# Patient Record
Sex: Female | Born: 1985
Health system: Southern US, Community
[De-identification: ages and names within clinical notes are randomized; demographics above are authoritative.]

## PROBLEM LIST (undated history)

## (undated) DIAGNOSIS — E559 Vitamin D deficiency, unspecified: Secondary | ICD-10-CM

## (undated) HISTORY — DX: Vitamin D deficiency, unspecified: E55.9

## (undated) HISTORY — PX: WISDOM TOOTH EXTRACTION: SHX21

---
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
## 2006-01-31 ENCOUNTER — Emergency Department: Payer: Self-pay | Admitting: Emergency Medicine

## 2009-04-02 ENCOUNTER — Emergency Department: Payer: Self-pay | Admitting: Emergency Medicine

## 2009-07-22 ENCOUNTER — Emergency Department: Payer: Self-pay | Admitting: Emergency Medicine

## 2010-02-16 ENCOUNTER — Observation Stay: Payer: Self-pay | Admitting: Obstetrics and Gynecology

## 2010-03-03 ENCOUNTER — Inpatient Hospital Stay: Payer: Self-pay

## 2011-01-16 IMAGING — CR RIGHT INDEX FINGER 2+V
1 series · 3 of 3 positions shown · non-contrast
Comparison: none

REASON FOR EXAM: pain after injury  ed waiting room
COMMENTS:   May transport without cardiac monitor

[Series 1: view not recorded · 0.17mm/px · 3 of 3 slices shown]
[im 1/3]
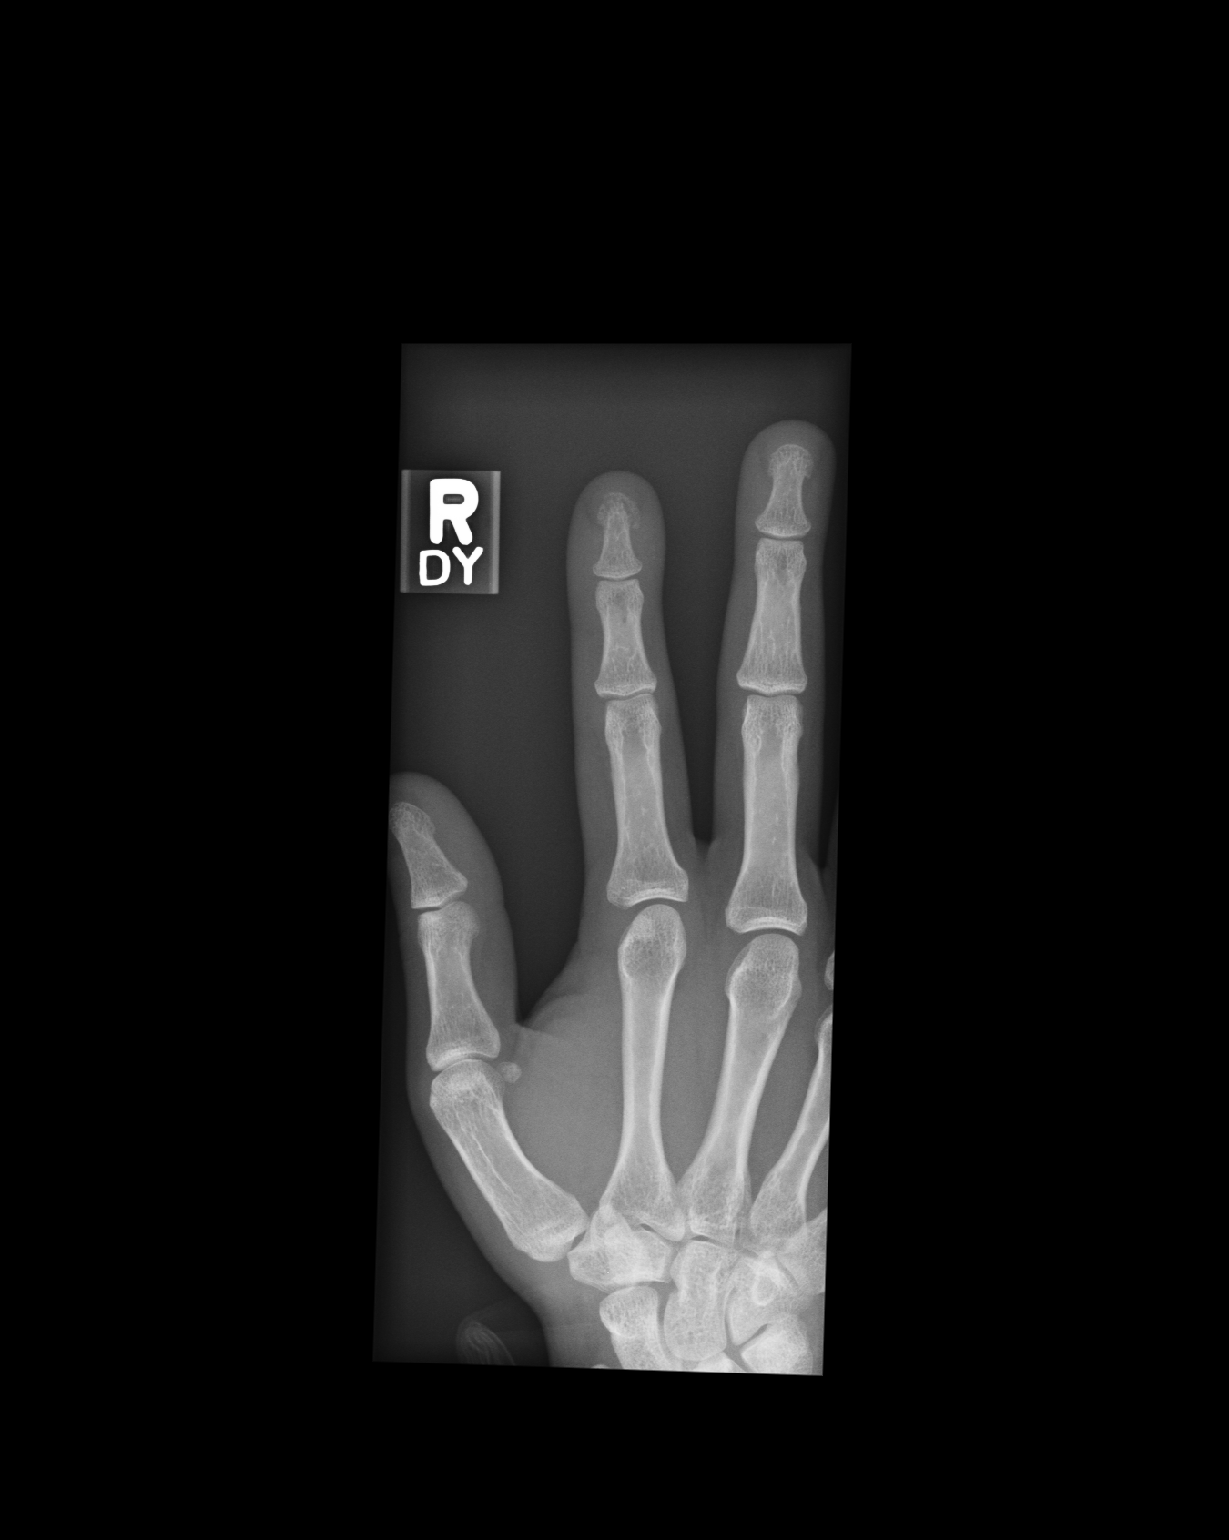
[im 2/3]
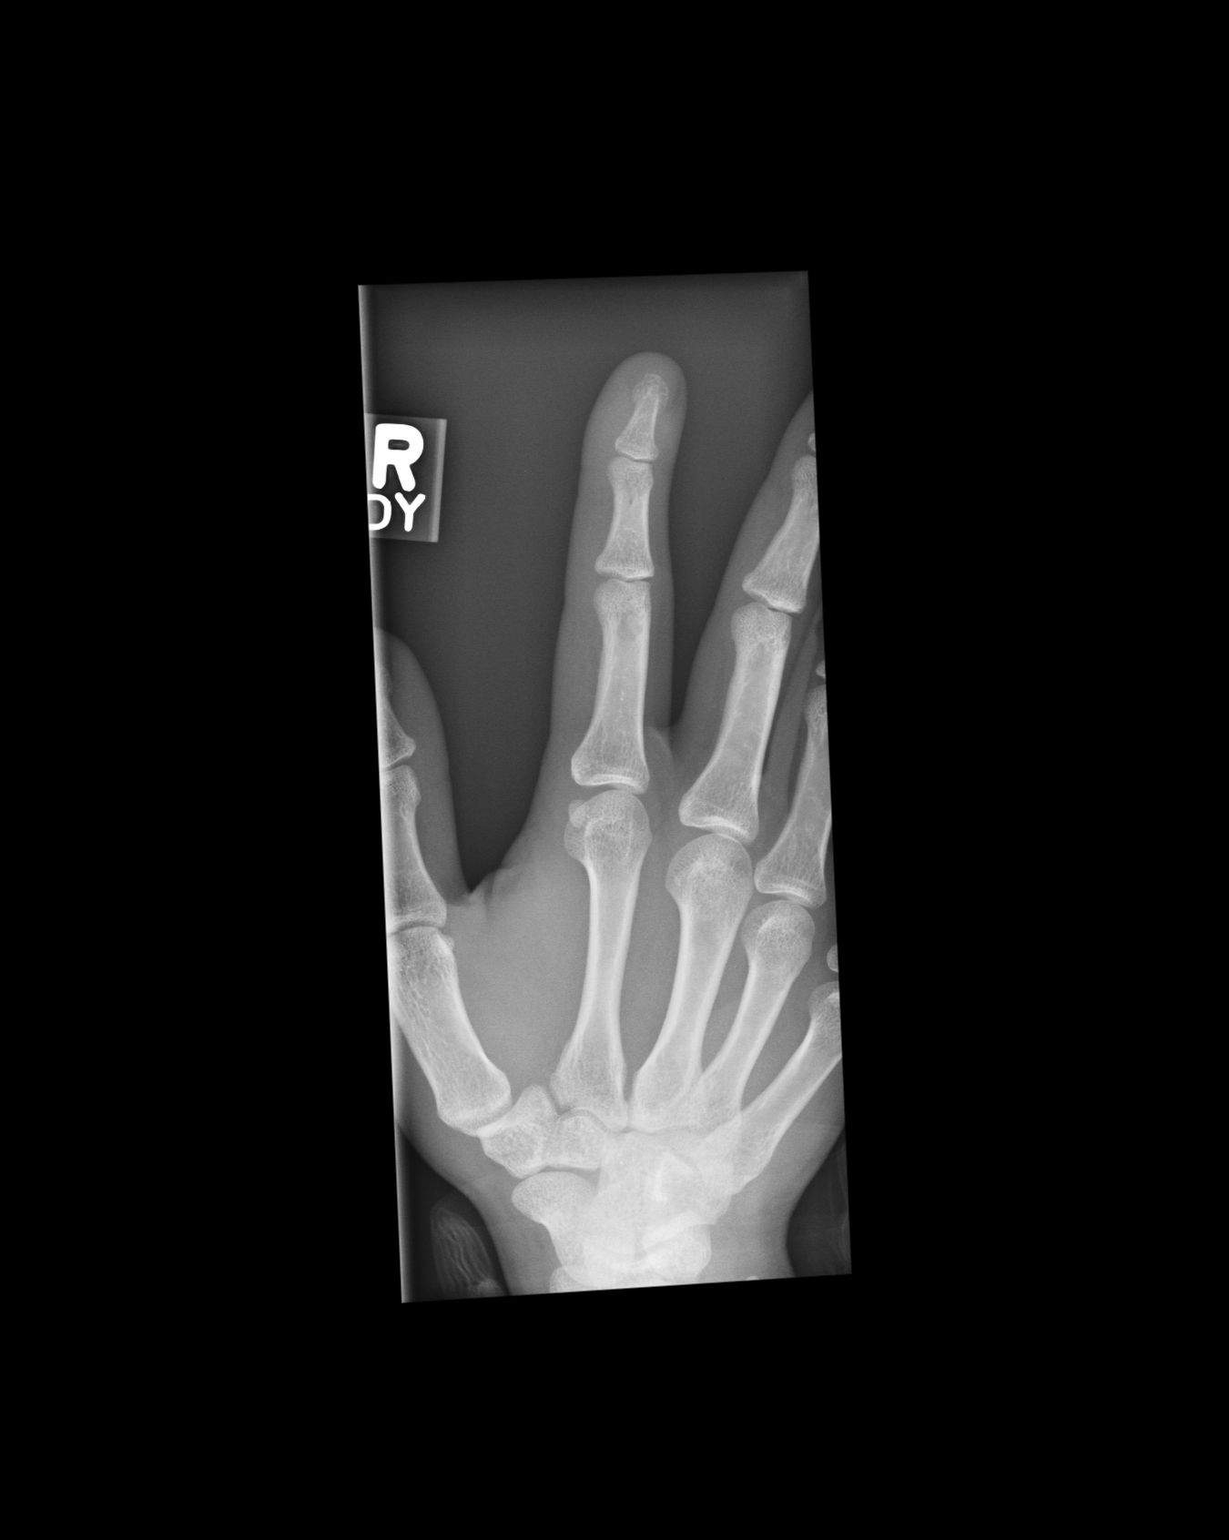
[im 3/3]
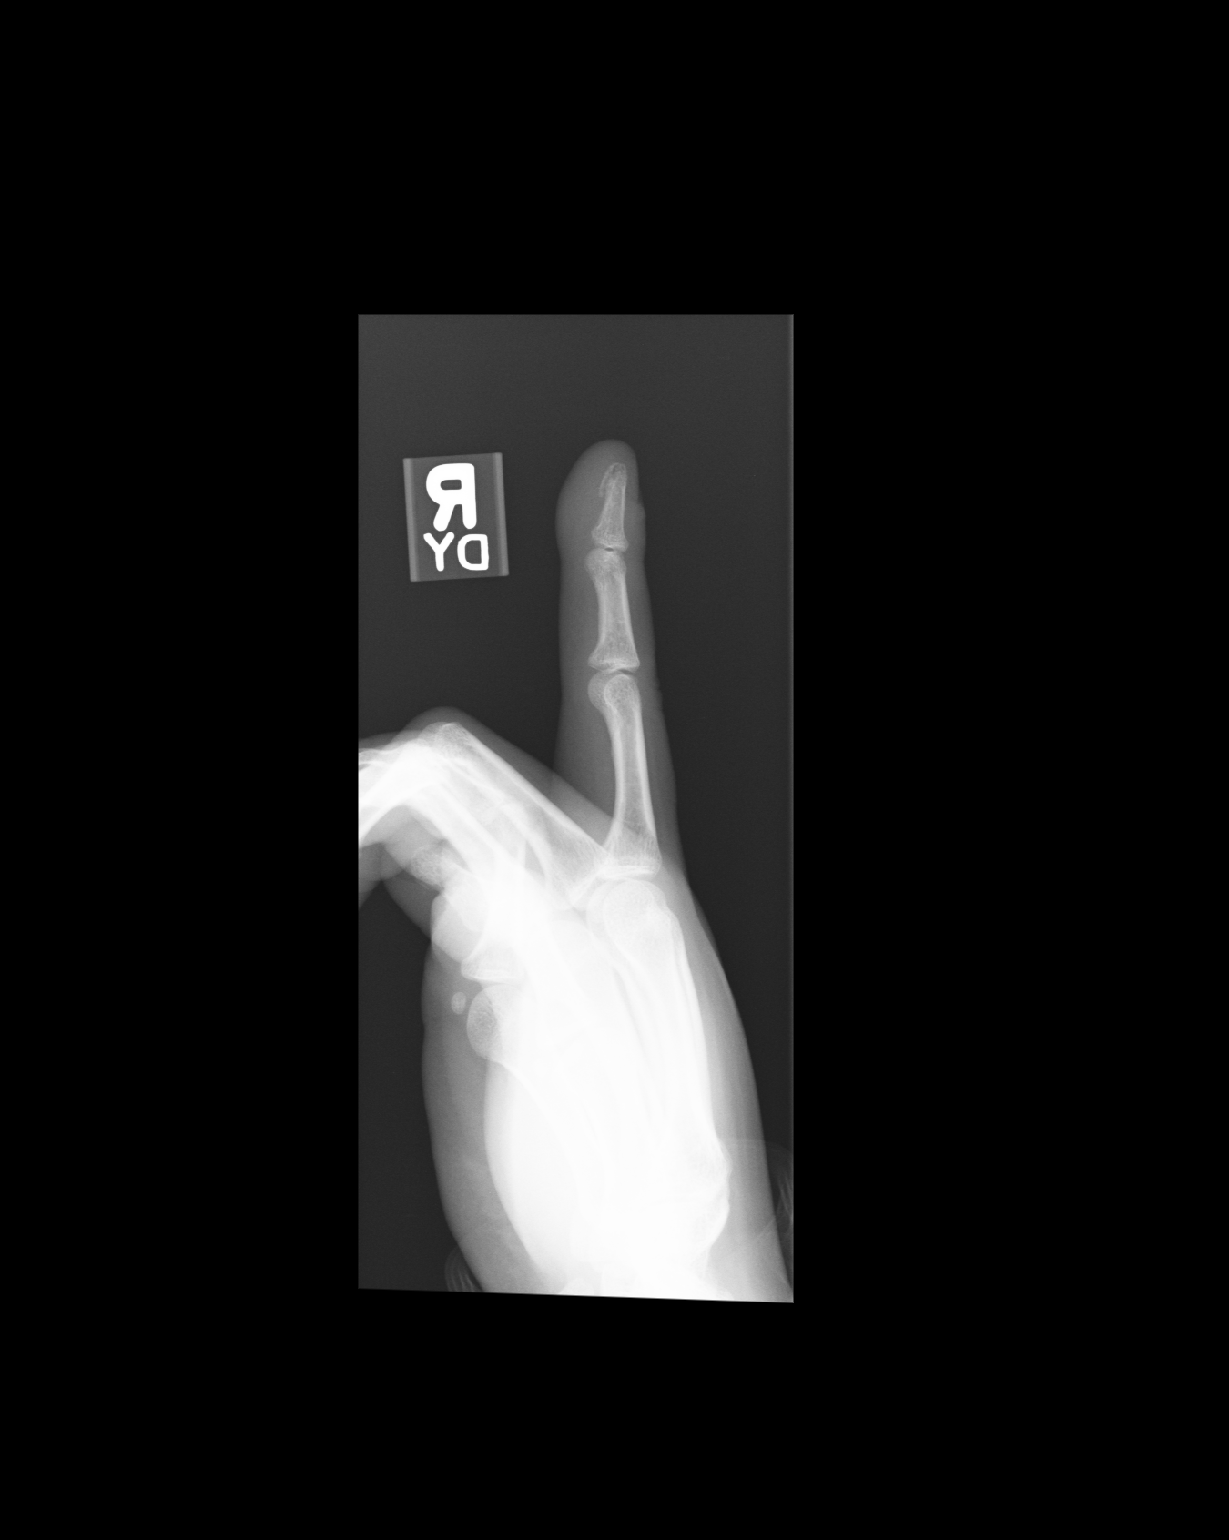

[3 of 3 positions shown; findings below may reference images not displayed]

PROCEDURE:     DXR - DXR FINGER INDEX 2ND DIGIT RT HA  - April 02, 2009 [DATE]

RESULT:     There is observed increased lucency along the medial aspect of
the tuft of the distal phalanx. The possibility of a nondisplaced fracture
of the tuft cannot be totally excluded although the findings also could be
secondary to prominent trabecular markings. No displaced fractures are seen.
No radiodense soft tissue foreign body is observed.
IMPRESSION: 1.     Possible nondisplaced fracture of the tuft of the distal phalanx.

## 2011-03-09 ENCOUNTER — Ambulatory Visit: Payer: Self-pay

## 2011-05-19 ENCOUNTER — Ambulatory Visit: Payer: Self-pay | Admitting: Internal Medicine

## 2011-08-12 ENCOUNTER — Ambulatory Visit: Payer: Self-pay | Admitting: Family Medicine

## 2012-09-10 ENCOUNTER — Emergency Department: Payer: Self-pay | Admitting: Emergency Medicine

## 2012-09-10 ENCOUNTER — Ambulatory Visit: Payer: Self-pay | Admitting: Physician Assistant

## 2012-09-10 LAB — PREGNANCY, URINE: Pregnancy Test, Urine: NEGATIVE m[IU]/mL

## 2012-09-10 LAB — BASIC METABOLIC PANEL
Anion Gap: 9 (ref 7–16)
BUN: 8 mg/dL (ref 7–18)
Calcium, Total: 8.8 mg/dL (ref 8.5–10.1)
Chloride: 105 mmol/L (ref 98–107)
Co2: 27 mmol/L (ref 21–32)
Glucose: 83 mg/dL (ref 65–99)
Sodium: 141 mmol/L (ref 136–145)

## 2012-09-10 LAB — CBC WITH DIFFERENTIAL/PLATELET
Basophil #: 0.1 10*3/uL (ref 0.0–0.1)
Basophil %: 1.1 %
Lymphocyte #: 1.7 10*3/uL (ref 1.0–3.6)
Lymphocyte %: 34.8 %
MCH: 29.2 pg (ref 26.0–34.0)
MCHC: 33.6 g/dL (ref 32.0–36.0)
MCV: 87 fL (ref 80–100)
Monocyte #: 0.4 x10 3/mm (ref 0.2–0.9)
RBC: 4.86 10*6/uL (ref 3.80–5.20)

## 2012-09-10 LAB — URINALYSIS, COMPLETE
Glucose,UR: NEGATIVE mg/dL (ref 0–75)
Ph: 7.5 (ref 4.5–8.0)
Specific Gravity: 1.01 (ref 1.003–1.030)

## 2012-09-12 ENCOUNTER — Ambulatory Visit: Payer: Self-pay | Admitting: Emergency Medicine

## 2012-09-12 LAB — URINE CULTURE

## 2013-06-26 ENCOUNTER — Ambulatory Visit: Payer: Self-pay | Admitting: Physician Assistant

## 2013-12-07 ENCOUNTER — Ambulatory Visit: Payer: Self-pay | Admitting: Emergency Medicine

## 2014-04-19 ENCOUNTER — Ambulatory Visit: Payer: Self-pay | Admitting: Physician Assistant

## 2014-08-24 LAB — HM PAP SMEAR: HM PAP: NEGATIVE

## 2015-01-04 ENCOUNTER — Ambulatory Visit
Admission: EM | Admit: 2015-01-04 | Discharge: 2015-01-04 | Disposition: A | Discharge status: Home / Self Care | Payer: 59 | Attending: Family Medicine | Admitting: Family Medicine

## 2015-01-04 DIAGNOSIS — J069 Acute upper respiratory infection, unspecified: Secondary | ICD-10-CM

## 2015-01-04 LAB — RAPID INFLUENZA A&B ANTIGENS: Influenza B (ARMC): NOT DETECTED

## 2015-01-04 LAB — RAPID INFLUENZA A&B ANTIGENS (ARMC ONLY): INFLUENZA A (ARMC): NOT DETECTED

## 2015-01-04 NOTE — Discharge Instructions (Signed)
Rest, increase fluids Tylenol or ibuprofen as needed as directed Mucinex twice daily as directed for congestion  Upper Respiratory Infection, Adult Most upper respiratory infections (URIs) are a viral infection of the air passages leading to the lungs. A URI affects the nose, throat, and upper air passages. The most common type of URI is nasopharyngitis and is typically referred to as "the common cold." URIs run their course and usually go away on their own. Most of the time, a URI does not require medical attention, but sometimes a bacterial infection in the upper airways can follow a viral infection. This is called a secondary infection. Sinus and middle ear infections are common types of secondary upper respiratory infections. Bacterial pneumonia can also complicate a URI. A URI can worsen asthma and chronic obstructive pulmonary disease (COPD). Sometimes, these complications can require emergency medical care and may be life threatening.  CAUSES Almost all URIs are caused by viruses. A virus is a type of germ and can spread from one person to another.  RISKS FACTORS You may be at risk for a URI if:   You smoke.   You have chronic heart or lung disease.  You have a weakened defense (immune) system.   You are very young or very old.   You have nasal allergies or asthma.  You work in crowded or poorly ventilated areas.  You work in health care facilities or schools. SIGNS AND SYMPTOMS  Symptoms typically develop 2-3 days after you come in contact with a cold virus. Most viral URIs last 7-10 days. However, viral URIs from the influenza virus (flu virus) can last 14-18 days and are typically more severe. Symptoms may include:   Runny or stuffy (congested) nose.   Sneezing.   Cough.   Sore throat.   Headache.   Fatigue.   Fever.   Loss of appetite.   Pain in your forehead, behind your eyes, and over your cheekbones (sinus pain).  Muscle aches.  DIAGNOSIS    Your health care provider may diagnose a URI by:  Physical exam.  Tests to check that your symptoms are not due to another condition such as:  Strep throat.  Sinusitis.  Pneumonia.  Asthma. TREATMENT  A URI goes away on its own with time. It cannot be cured with medicines, but medicines may be prescribed or recommended to relieve symptoms. Medicines may help:  Reduce your fever.  Reduce your cough.  Relieve nasal congestion. HOME CARE INSTRUCTIONS   Take medicines only as directed by your health care provider.   Gargle warm saltwater or take cough drops to comfort your throat as directed by your health care provider.  Use a warm mist humidifier or inhale steam from a shower to increase air moisture. This may make it easier to breathe.  Drink enough fluid to keep your urine clear or pale yellow.   Eat soups and other clear broths and maintain good nutrition.   Rest as needed.   Return to work when your temperature has returned to normal or as your health care provider advises. You may need to stay home longer to avoid infecting others. You can also use a face mask and careful hand washing to prevent spread of the virus.  Increase the usage of your inhaler if you have asthma.   Do not use any tobacco products, including cigarettes, chewing tobacco, or electronic cigarettes. If you need help quitting, ask your health care provider. PREVENTION  The best way to protect yourself from  getting a cold is to practice good hygiene.   Avoid oral or hand contact with people with cold symptoms.   Wash your hands often if contact occurs.  There is no clear evidence that vitamin C, vitamin E, echinacea, or exercise reduces the chance of developing a cold. However, it is always recommended to get plenty of rest, exercise, and practice good nutrition.  SEEK MEDICAL CARE IF:   You are getting worse rather than better.   Your symptoms are not controlled by medicine.   You  have chills.  You have worsening shortness of breath.  You have brown or red mucus.  You have yellow or brown nasal discharge.  You have pain in your face, especially when you bend forward.  You have a fever.  You have swollen neck glands.  You have pain while swallowing.  You have white areas in the back of your throat. SEEK IMMEDIATE MEDICAL CARE IF:   You have severe or persistent:  Headache.  Ear pain.  Sinus pain.  Chest pain.  You have chronic lung disease and any of the following:  Wheezing.  Prolonged cough.  Coughing up blood.  A change in your usual mucus.  You have a stiff neck.  You have changes in your:  Vision.  Hearing.  Thinking.  Mood. MAKE SURE YOU:   Understand these instructions.  Will watch your condition.  Will get help right away if you are not doing well or get worse.   This information is not intended to replace advice given to you by your health care provider. Make sure you discuss any questions you have with your health care provider.   Document Released: 07/29/2000 Document Revised: 06/19/2014 Document Reviewed: 05/10/2013 Elsevier Interactive Patient Education [employer]

## 2015-01-04 NOTE — ED Provider Notes (Signed)
CSN: [id number]     Arrival date & time 01/04/15  1010 History   First MD Initiated Contact with Patient 01/04/15 1236     Chief Complaint  Patient presents with  . URI   HPI   Karen Reyes is a pleasant 29 y.o. female who presents with acute onset of nasal congestion, sore throat, rhinorrhea, fever, and right ear pain. She says her symptoms started last night. She has had nonproductive cough as well. Her MAXIMUM TEMPERATURE was 101. She has a few body aches. She has not received a flu shot this year. Her pain is 2/10. Rest makes her symptoms better. Tylenol helped very minimally. She denies any rash. Denies any ill contacts.   History reviewed. No pertinent past medical history. Past Surgical History  Procedure Laterality Date  . Cesarean section      2012   History reviewed. No pertinent family history. Social History  Substance Use Topics  . Smoking status: Current Every Day Smoker -- 1.00 packs/day    Types: Cigarettes  . Smokeless tobacco: None  . Alcohol Use: Yes     Comment: socially   OB History    No data available     Review of Systems  Constitutional: Negative.   HENT: Positive for congestion, ear pain, postnasal drip, rhinorrhea, sinus pressure, sneezing and sore throat. Negative for ear discharge, tinnitus and trouble swallowing.   Eyes: Negative.   Respiratory: Negative.   Cardiovascular: Negative.   Gastrointestinal: Negative.   Endocrine: Negative.   Genitourinary: Negative.   Musculoskeletal: Positive for myalgias.  Skin: Negative.   Neurological: Negative.   Hematological: Negative.   Psychiatric/Behavioral: Negative.     Allergies  Ciprofloxacin and Penicillins  Home Medications   Prior to Admission medications   Medication Sig Start Date End Date Taking? Authorizing Provider  levonorgestrel (MIRENA, 52 MG,) 20 MCG/24HR IUD 1 each by Intrauterine route once.   Yes Historical Provider, MD   Meds Ordered and Administered this Visit   Medications - No data to display  BP 101/73 mmHg  Pulse 96  Temp(Src) 97.5 F (36.4 C) (Tympanic)  Resp 16  Ht  (1.651 m)  Wt 160 lb (72.576 kg)  BMI 26.63 kg/m2  SpO2 100% No data found.   Physical Exam  Constitutional: She is oriented to person, place, and time. She appears well-developed and well-nourished. No distress.  HENT:  Head: Normocephalic and atraumatic.  Right Ear: Hearing, external ear and ear canal normal. Tympanic membrane is injected, erythematous and bulging.  Left Ear: Hearing, external ear and ear canal normal.  Nose: Mucosal edema and rhinorrhea present. Right sinus exhibits no maxillary sinus tenderness and no frontal sinus tenderness. Left sinus exhibits no maxillary sinus tenderness and no frontal sinus tenderness.  Mouth/Throat: Uvula is midline and mucous membranes are normal. Posterior oropharyngeal erythema present. No oropharyngeal exudate, posterior oropharyngeal edema or tonsillar abscesses.  Eyes: Conjunctivae are normal. Right eye exhibits no discharge. Left eye exhibits no discharge. No scleral icterus.  Neck: Normal range of motion. Neck supple. No thyromegaly present.  Cardiovascular: Normal rate and regular rhythm.  Exam reveals no gallop and no friction rub.   No murmur heard. Pulmonary/Chest: Effort normal and breath sounds normal. No respiratory distress. She has no wheezes. She has no rales.  Abdominal: Soft. Bowel sounds are normal. She exhibits no distension. There is no tenderness. There is no rebound and no guarding.  Musculoskeletal: Normal range of motion. She exhibits no edema or tenderness.  Lymphadenopathy:    She has no cervical adenopathy.  Neurological: She is alert and oriented to person, place, and time. No cranial nerve deficit.  Skin: Skin is warm and dry. No rash noted. No erythema.  Psychiatric: She has a normal mood and affect. Her behavior is normal. Judgment normal.  Nursing note and vitals reviewed.   ED Course   Procedures NA  Labs Review Labs Reviewed  RAPID INFLUENZA A&B ANTIGENS [facility] ONLY)    Imaging Review No results found.   MDM   1. Acute URI   Explained to patient this is likely viral URI. Cough and symptoms may persist for up to 3 weeks at times. Continue supportive measures. Reassured and discussed warning signs for patient to return to clinic including SOB, weakness, unrelenting fever, severe pain or worsening symptoms.  Plan: Diagnosis reviewed with patient Ibuprofen 400-600mg  TID PRN or tylenol PRN Mucinex 1-2 tabs BID PRN Recommend supportive treatment with rest,  Increased fluids Seek additional medical care if symptoms worsen or are not improving    Joselyn ArrowKandice L Therma Lasure, NP 01/04/15 1335

## 2015-01-04 NOTE — ED Notes (Signed)
Started yesterday with runny nose, post nasal drip and now sore throat

## 2015-09-13 ENCOUNTER — Ambulatory Visit
Admission: EM | Admit: 2015-09-13 | Discharge: 2015-09-13 | Disposition: A | Discharge status: Home / Self Care | Payer: 59 | Attending: Family Medicine | Admitting: Family Medicine

## 2015-09-13 DIAGNOSIS — A09 Infectious gastroenteritis and colitis, unspecified: Secondary | ICD-10-CM | POA: Diagnosis not present

## 2015-09-13 DIAGNOSIS — K529 Noninfective gastroenteritis and colitis, unspecified: Secondary | ICD-10-CM

## 2015-09-13 DIAGNOSIS — R197 Diarrhea, unspecified: Secondary | ICD-10-CM

## 2015-09-13 LAB — CBC WITH DIFFERENTIAL/PLATELET
Basophils Absolute: 0 10*3/uL (ref 0–0.1)
Basophils Relative: 1 %
Eosinophils Absolute: 0.2 10*3/uL (ref 0–0.7)
Eosinophils Relative: 2 %
HCT: 43.3 % (ref 35.0–47.0)
Hemoglobin: 14.6 g/dL (ref 12.0–16.0)
Lymphocytes Relative: 15 %
Lymphs Abs: 1.2 10*3/uL (ref 1.0–3.6)
MCH: 29.6 pg (ref 26.0–34.0)
MCHC: 33.7 g/dL (ref 32.0–36.0)
MCV: 87.7 fL (ref 80.0–100.0)
Monocytes Absolute: 0.8 10*3/uL (ref 0.2–0.9)
Monocytes Relative: 10 %
Neutro Abs: 5.9 10*3/uL (ref 1.4–6.5)
Neutrophils Relative %: 72 %
Platelets: 273 10*3/uL (ref 150–440)
RBC: 4.94 MIL/uL (ref 3.80–5.20)
RDW: 14.8 % — ABNORMAL HIGH (ref 11.5–14.5)
WBC: 8 10*3/uL (ref 3.6–11.0)

## 2015-09-13 LAB — COMPREHENSIVE METABOLIC PANEL
ALT: 29 U/L (ref 14–54)
AST: 19 U/L (ref 15–41)
Albumin: 4.2 g/dL (ref 3.5–5.0)
Alkaline Phosphatase: 58 U/L (ref 38–126)
Anion gap: 6 (ref 5–15)
BUN: 9 mg/dL (ref 6–20)
CO2: 26 mmol/L (ref 22–32)
Calcium: 8.9 mg/dL (ref 8.9–10.3)
Chloride: 104 mmol/L (ref 101–111)
Creatinine, Ser: 0.66 mg/dL (ref 0.44–1.00)
GFR calc Af Amer: >60 mL/min (ref >60)
GFR calc non Af Amer: >60 mL/min (ref >60)
Glucose, Bld: 86 mg/dL (ref 65–99)
Potassium: 3.8 mmol/L (ref 3.5–5.1)
Sodium: 136 mmol/L (ref 135–145)
Total Bilirubin: 0.7 mg/dL (ref 0.3–1.2)
Total Protein: 7.4 g/dL (ref 6.5–8.1)

## 2015-09-13 MED ORDER — ONDANSETRON 8 MG PO TBDP
8.0000 mg | ORAL_TABLET | Freq: Once | ORAL | Status: AC
  Administered 2015-09-13: 8 mg via ORAL

## 2015-09-13 MED ORDER — ONDANSETRON 8 MG PO TBDP: 8.0000 mg | ORAL_TABLET | Freq: Two times a day (BID) | ORAL | 0 refills | Status: DC

## 2015-09-13 NOTE — ED Triage Notes (Signed)
Pt c/o N/V/D since last night.. Emesis x2, diarrhea x3 today.Marland Kitchen

## 2015-09-13 NOTE — ED Provider Notes (Signed)
CSN: [id number]     Arrival date & time 09/13/15  1442 History   First MD Initiated Contact with Patient 09/13/15 1632     Chief Complaint  Patient presents with  . Emesis  . Diarrhea  . Sore Throat   (Consider location/radiation/quality/duration/timing/severity/associated sxs/prior Treatment) HPI   This is a 30 year old female who presents with the sudden onset of watery diarrhea without mucus or blood vomiting 3 and queasy feeling of her stomach although she does deny any pain. She ate at the [employer] last night around 7 PM began vomiting have diarrhea around 1 AM. Her last bowel movement was at 1 PM this afternoon she vomited shortly before I went in the room according to the patient. Is no dark coffee-ground material; in her last bowel movement was green. He states that her boyfriend ate very similar food that she had and he also is not feeling well although has not had this severe symptoms but his vomiting and has diarrhea. .denies any fever or chills.  History reviewed. No pertinent past medical history. Past Surgical History:  Procedure Laterality Date  . CESAREAN SECTION     2012   Family History  Problem Relation Age of Onset  . Healthy Mother   . Healthy Father    Social History  Substance Use Topics  . Smoking status: Current Every Day Smoker    Packs/day: 1.00    Types: Cigarettes  . Smokeless tobacco: Never Used  . Alcohol use Yes     Comment: socially   OB History    No data available     Review of Systems  Constitutional: Positive for activity change and appetite change. Negative for chills, fatigue and fever.  All other systems reviewed and are negative.   Allergies  Ciprofloxacin and Penicillins  Home Medications   Prior to Admission medications   Medication Sig Start Date End Date Taking? Authorizing Provider  levonorgestrel (MIRENA, 52 MG,) 20 MCG/24HR IUD 1 each by Intrauterine route once.    Historical Provider, MD  ondansetron (ZOFRAN  ODT) 8 MG disintegrating tablet Take 1 tablet (8 mg total) by mouth 2 (two) times daily. 09/13/15   Lorin Picket, PA-C   Meds Ordered and Administered this Visit   Medications  ondansetron (ZOFRAN-ODT) disintegrating tablet 8 mg (8 mg Oral Given 09/13/15 1651)    BP 128/65 (BP Location: Left Arm)   Pulse (!) 102   Temp 98.2 F (36.8 C) (Oral)   Resp 18   Ht '5\' 2"'  (1.575 m)   Wt 148 lb (67.1 kg)   SpO2 99%   BMI 27.07 kg/m  No data found.   Physical Exam  Constitutional: She is oriented to person, place, and time. She appears well-developed and well-nourished. No distress.  HENT:  Head: Normocephalic and atraumatic.  Eyes: EOM are normal. Pupils are equal, round, and reactive to light.  Neck: Normal range of motion. Neck supple.  Pulmonary/Chest: Effort normal and breath sounds normal. No respiratory distress. She has no wheezes. She has no rales.  Abdominal: Soft. Bowel sounds are normal. She exhibits no distension and no mass. There is tenderness. There is no rebound and no guarding.  She has some mild tenderness in the right lower quadrant No other tenderness could be elicited. Nira Conn, RN was a Education officer, museum  Musculoskeletal: Normal range of motion. She exhibits no edema, tenderness or deformity.  Neurological: She is alert and oriented to person, place, and time.  Skin: Skin is warm  and dry. She is not diaphoretic.  Psychiatric: She has a normal mood and affect. Her behavior is normal. Judgment and thought content normal.  Nursing note and vitals reviewed.   Urgent Care Course   Clinical Course    Procedures (including critical care time)  Labs Review Labs Reviewed  CBC WITH DIFFERENTIAL/PLATELET - Abnormal; Notable for the following:       Result Value   RDW 14.8 (*)    All other components within normal limits  COMPREHENSIVE METABOLIC PANEL    Imaging Review No results found.   Visual Acuity Review  Right Eye Distance:   Left Eye  Distance:   Bilateral Distance:    Right Eye Near:   Left Eye Near:    Bilateral Near:     Medications  ondansetron (ZOFRAN-ODT) disintegrating tablet 8 mg (8 mg Oral Given 09/13/15 1651)      MDM   1. Diarrhea of presumed infectious origin   2. Gastroenteritis    New Prescriptions   ONDANSETRON (ZOFRAN ODT) 8 MG DISINTEGRATING TABLET    Take 1 tablet (8 mg total) by mouth 2 (two) times daily.  Plan: 1. Test/x-ray results and diagnosis reviewed with patient 2. rx as per orders; risks, benefits, potential side effects reviewed with patient 3. Recommend supportive treatment with Increased fluids. Patient is given a kit and a prescription for a GI panel by PCR and she'll return to our clinic tomorrow. Her pain becomes worse in the right lower quadrant runs a fever or is having intractable nausea or vomiting or diarrhea she should go immediately to the emergency department 4. F/u prn if symptoms worsen or don't improve    Lorin Picket, PA-C 09/13/15 1801

## 2016-11-12 ENCOUNTER — Emergency Department: Payer: Self-pay

## 2016-11-12 ENCOUNTER — Emergency Department
Admission: EM | Admit: 2016-11-12 | Discharge: 2016-11-12 | Disposition: A | Discharge status: Home / Self Care | Payer: Self-pay | Attending: Emergency Medicine | Admitting: Emergency Medicine

## 2016-11-12 ENCOUNTER — Encounter: Payer: Self-pay | Admitting: Emergency Medicine

## 2016-11-12 DIAGNOSIS — F17210 Nicotine dependence, cigarettes, uncomplicated: Secondary | ICD-10-CM | POA: Insufficient documentation

## 2016-11-12 DIAGNOSIS — Z79899 Other long term (current) drug therapy: Secondary | ICD-10-CM | POA: Insufficient documentation

## 2016-11-12 DIAGNOSIS — J40 Bronchitis, not specified as acute or chronic: Secondary | ICD-10-CM | POA: Insufficient documentation

## 2016-11-12 DIAGNOSIS — J040 Acute laryngitis: Secondary | ICD-10-CM | POA: Insufficient documentation

## 2016-11-12 MED ORDER — METHYLPREDNISOLONE 4 MG PO TBPK: ORAL_TABLET | ORAL | 0 refills | Status: DC

## 2016-11-12 MED ORDER — PSEUDOEPH-BROMPHEN-DM 30-2-10 MG/5ML PO SYRP: 5.0000 mL | ORAL_SOLUTION | Freq: Four times a day (QID) | ORAL | 0 refills | Status: DC | PRN

## 2016-11-12 MED ORDER — AZITHROMYCIN 250 MG PO TABS: ORAL_TABLET | ORAL | 0 refills | Status: AC

## 2016-11-12 NOTE — ED Provider Notes (Signed)
[facility] Emergency Department Provider Note  Patient complaining of cough, nasal congestion and cough ____________________________________________   First MD Initiated Contact with Patient 11/12/16 1648     (approximate)  I have reviewed the triage vital signs and the nursing notes.   HISTORY  Chief Complaint    HPI Karen Reyes is a 30 y.o. female patient complaining of cough, congestion, sore throat for 1 week. Patient stated cough initially nonproductive but is now productive of bloody sputum. Patient also complaining of some decreased forced volume and body aches..Patient rates her pain as a 5/10. Patient's, pain as "achy". Patient states no nausea or vomiting. Patient state loose stools but she would not characterize the stool as diarrhea. No palliative measures for complaint.  No past medical history on file.  There are no active problems to display for this patient.   Past Surgical History:  Procedure Laterality Date  . CESAREAN SECTION     2012    Prior to Admission medications   Medication Sig Start Date End Date Taking? Authorizing Provider  azithromycin (ZITHROMAX Z-PAK) 250 MG tablet Take 2 tablets (500 mg) on  Day 1,  followed by 1 tablet (250 mg) once daily on Days 2 through 5. 11/12/16 11/17/16  Joni Reining, PA-C  brompheniramine-pseudoephedrine-DM 30-2-10 MG/5ML syrup Take 5 mLs by mouth 4 (four) times daily as needed. 11/12/16   Joni Reining, PA-C  levonorgestrel (MIRENA, 52 MG,) 20 MCG/24HR IUD 1 each by Intrauterine route once.    [provider]  methylPREDNISolone (MEDROL DOSEPAK) 4 MG TBPK tablet Take Tapered dose as directed 11/12/16   Joni Reining, PA-C  ondansetron (ZOFRAN ODT) 8 MG disintegrating tablet Take 1 tablet (8 mg total) by mouth 2 (two) times daily. 09/13/15   Lutricia Feil, PA-C    Allergies Ciprofloxacin and Penicillins  Family History  Problem Relation Age of Onset  . Healthy  Mother   . Healthy Father     Social History Social History  Substance Use Topics  . Smoking status: Current Every Day Smoker    Packs/day: 1.00    Types: Cigarettes  . Smokeless tobacco: Never Used  . Alcohol use Yes     Comment: socially    Review of Systems  Constitutional: No fever/chills. Body aches Eyes: No visual changes. ENT: Sore throat and nasal congestion. Cardiovascular: Denies chest pain. Respiratory: Denies shortness of breath. Productive cough cough. Gastrointestinal: No abdominal pain.  No nausea, no vomiting.  No diarrhea.  No constipation. Genitourinary: Negative for dysuria. Musculoskeletal: Negative for back pain. Skin: Negative for rash. Neurological: Negative for headaches, focal weakness or numbness. Allergic/Immunilogical: Cipro and penicillin ____________________________________________   PHYSICAL EXAM:  VITAL SIGNS: ED Triage Vitals  Enc Vitals Group     BP 11/12/16 1642 116/66     Pulse Rate 11/12/16 1642 94     Resp 11/12/16 1642 20     Temp 11/12/16 1642 98.4 F (36.9 C)     Temp Source 11/12/16 1642 Oral     SpO2 11/12/16 1642 100 %     Weight 11/12/16 1637 132 lb (59.9 kg)     Height 11/12/16 1637  (1.626 m)     Head Circumference --      Peak Flow --      Pain Score 11/12/16 1637 5     Pain Loc --      Pain Edu? --      Excl. in GC? --  Constitutional: Alert and oriented. Well appearing and in no acute distress. Nose: Edematous nasal turbinates clear rhinorrhea Mouth/Throat: Mucous membranes are moist.  Oropharynx non-erythematous. Neck: No stridor. Hematological/Lymphatic/Immunilogical: No cervical lymphadenopathy. Cardiovascular: Normal rate, regular rhythm. Grossly normal heart sounds.  Good peripheral circulation. Respiratory: Normal respiratory effort.  No retractions. Lungs Diffuse Rales. Musculoskeletal: No lower extremity tenderness nor edema.  No joint effusions. Neurologic:  Normal speech and language. No  gross focal neurologic deficits are appreciated. No gait instability. Skin:  Skin is warm, dry and intact. No rash noted. Psychiatric: Mood and affect are normal. Speech and behavior are normal.  ____________________________________________   LABS (all labs ordered are listed, but only abnormal results are displayed)  Labs Reviewed - No data to display ____________________________________________  EKG   ____________________________________________  RADIOLOGY  Dg Chest 2 View  Result Date: 11/12/2016 CLINICAL DATA:  Cough and congestion. EXAM: CHEST  2 VIEW COMPARISON:  No recent prior . FINDINGS: Mediastinum hilar structures normal scratched it mediastinum hilar structures are unremarkable. Mild peribronchial cuffing. Mild bronchitis cannot be excluded. No focal infiltrate. No pleural effusion or pneumothorax. IMPRESSION: Mild peribronchial cuffing. Mild bronchitis cannot be excluded. Exam otherwise unremarkable. Electronically Signed   By: Maisie Fus  Register   On: 11/12/2016 17:12    __Chest x-ray consistent with bronchitis __________________________________________   PROCEDURES  Procedure(s) performed: None  Procedures  Critical Care performed: No  ____________________________________________   INITIAL IMPRESSION / ASSESSMENT AND PLAN / ED COURSE  Pertinent labs & imaging results that were available during my care of the patient were reviewed by me and considered in my medical decision making (see chart for details).  Abdomen congestion secondary to bronchitis. Patient also has laryngitis. Patient given discharge care instructions. Patient advised take medication as directed. Patient advised to follow-up with [facility] if condition persists.      ____________________________________________   FINAL CLINICAL IMPRESSION(S) / ED DIAGNOSES  Final diagnoses:  Bronchitis  Laryngitis      NEW MEDICATIONS STARTED DURING THIS VISIT:  New  Prescriptions   AZITHROMYCIN (ZITHROMAX Z-PAK) 250 MG TABLET    Take 2 tablets (500 mg) on  Day 1,  followed by 1 tablet (250 mg) once daily on Days 2 through 5.   BROMPHENIRAMINE-PSEUDOEPHEDRINE-DM 30-2-10 MG/5ML SYRUP    Take 5 mLs by mouth 4 (four) times daily as needed.   METHYLPREDNISOLONE (MEDROL DOSEPAK) 4 MG TBPK TABLET    Take Tapered dose as directed     Note:  This document was prepared using Dragon voice recognition software and may include unintentional dictation errors.    Joni Reining, PA-C 11/12/16 1729    Jeanmarie Plant, MD 11/12/16 2055

## 2016-11-12 NOTE — ED Triage Notes (Signed)
States she developed cough,congestion and sore throat about 1 week ago  States cough was non prod at first now states she is coughing up some blood today   Also has had some body aches

## 2017-08-23 ENCOUNTER — Encounter: Payer: Self-pay | Admitting: Maternal Newborn

## 2017-08-26 ENCOUNTER — Other Ambulatory Visit (HOSPITAL_COMMUNITY)
Admission: RE | Admit: 2017-08-26 | Discharge: 2017-08-26 | Disposition: A | Discharge status: Home / Self Care | Payer: Managed Care, Other (non HMO) | Source: Ambulatory Visit | Attending: Maternal Newborn | Admitting: Maternal Newborn

## 2017-08-26 ENCOUNTER — Ambulatory Visit (INDEPENDENT_AMBULATORY_CARE_PROVIDER_SITE_OTHER): Payer: Managed Care, Other (non HMO) | Admitting: Maternal Newborn

## 2017-08-26 ENCOUNTER — Encounter: Payer: Self-pay | Admitting: Maternal Newborn

## 2017-08-26 VITALS — BP 90/60 | HR 90 | Ht 62.0 in | Wt 127.0 lb

## 2017-08-26 DIAGNOSIS — Z113 Encounter for screening for infections with a predominantly sexual mode of transmission: Secondary | ICD-10-CM | POA: Insufficient documentation

## 2017-08-26 DIAGNOSIS — Z23 Encounter for immunization: Secondary | ICD-10-CM

## 2017-08-26 DIAGNOSIS — Z01419 Encounter for gynecological examination (general) (routine) without abnormal findings: Secondary | ICD-10-CM

## 2017-08-26 DIAGNOSIS — E559 Vitamin D deficiency, unspecified: Secondary | ICD-10-CM | POA: Insufficient documentation

## 2017-08-26 DIAGNOSIS — Z124 Encounter for screening for malignant neoplasm of cervix: Secondary | ICD-10-CM | POA: Insufficient documentation

## 2017-08-26 DIAGNOSIS — Z01411 Encounter for gynecological examination (general) (routine) with abnormal findings: Secondary | ICD-10-CM

## 2017-08-26 DIAGNOSIS — F17210 Nicotine dependence, cigarettes, uncomplicated: Secondary | ICD-10-CM

## 2017-08-26 DIAGNOSIS — F17209 Nicotine dependence, unspecified, with unspecified nicotine-induced disorders: Secondary | ICD-10-CM | POA: Insufficient documentation

## 2017-08-26 MED ORDER — TETANUS-DIPHTH-ACELL PERTUSSIS 5-2.5-18.5 LF-MCG/0.5 IM SUSP
0.5000 mL | Freq: Once | INTRAMUSCULAR | Status: AC
  Administered 2017-08-26: 0.5 mL via INTRAMUSCULAR

## 2017-08-26 NOTE — Progress Notes (Signed)
Gynecology Annual Exam  PCP: Jenell Milliner, MD  Chief Complaint:  Chief Complaint  Patient presents with  . Gynecologic Exam    History of Present Illness: Patient is a 32 y.o. G1P1001 presents for annual exam. The patient has no complaints today.   LMP: No LMP recorded (lmp unknown). (Menstrual status: IUD). Menses absent with Mirena IUD.  The patient is sexually active. She currently uses IUD for contraception. She does not have dyspareunia.  The patient does perform self breast exams.  There is no notable family history of breast or ovarian cancer in her family.  The patient does not have current symptoms of depression.    Review of Systems  Constitutional: Positive for weight loss.  HENT: Negative.   Eyes: Negative.   Respiratory: Negative for cough, shortness of breath and wheezing.   Cardiovascular: Negative for chest pain and palpitations.  Gastrointestinal: Negative.   Genitourinary: Negative.   Musculoskeletal: Negative.   Skin: Negative.   Neurological: Negative.   Endo/Heme/Allergies: Negative.   Psychiatric/Behavioral: Negative.   All other systems reviewed and are negative.   Past Medical History:  Past Medical History:  Diagnosis Date  . Vitamin D deficiency     Past Surgical History:  Past Surgical History:  Procedure Laterality Date  . CESAREAN SECTION  03/04/2010  . WISDOM TOOTH EXTRACTION     one    Gynecologic History:  No LMP recorded (lmp unknown). (Menstrual status: IUD). Contraception: IUD Last Pap: 08/24/2014.  Results were: NIL   Obstetric History: G1P1001  Family History:  Family History  Problem Relation Age of Onset  . Healthy Mother   . Healthy Father   . COPD Maternal Grandmother   . Hypertension Maternal Grandmother   . Osteoporosis Maternal Grandmother   . Thyroid disease Maternal Grandmother   . Diabetes Maternal Grandfather        TYPE 2  . Heart disease Maternal Grandfather        DIED OF MI AGE 21    Social  History:  Social History   Socioeconomic History  . Marital status: Single    Spouse name: Not on file  . Number of children: 1  . Years of education: 53  . Highest education level: Not on file  Occupational History  . Occupation: RETAIL    Comment: [employer]  Social Needs  . Financial resource strain: Not on file  . Food insecurity:    Worry: Not on file    Inability: Not on file  . Transportation needs:    Medical: Not on file    Non-medical: Not on file  Tobacco Use  . Smoking status: Current Every Day Smoker    Packs/day: 0.50    Types: Cigarettes  . Smokeless tobacco: Never Used  Substance and Sexual Activity  . Alcohol use: Yes    Comment: socially  . Drug use: Yes    Types: Marijuana    Comment: LIGHT  . Sexual activity: Yes    Birth control/protection: IUD  Lifestyle  . Physical activity:    Days per week: Not on file    Minutes per session: Not on file  . Stress: Not on file  Relationships  . Social connections:    Talks on phone: Not on file    Gets together: Not on file    Attends religious service: Not on file    Active member of club or organization: Not on file    Attends meetings of clubs or organizations:  Not on file    Relationship status: Not on file  . Intimate partner violence:    Fear of current or ex partner: Not on file    Emotionally abused: Not on file    Physically abused: Not on file    Forced sexual activity: Not on file  Other Topics Concern  . Not on file  Social History Narrative  . Not on file    Allergies:  Allergies  Allergen Reactions  . Ciprofloxacin Hives    BODY RASH, THROAT CLOSED UP  . Penicillins Hives and Nausea And Vomiting    HIVES; BREATHING DIFFICULTY    Medications: Prior to Admission medications   Medication Sig Start Date End Date Taking? Authorizing Provider  Cholecalciferol (D 10000) 10000 units CAPS Take 1 capsule by mouth daily. 08/16/17  Yes [provider]  levonorgestrel (MIRENA,  52 MG,) 20 MCG/24HR IUD 1 each by Intrauterine route once.   Yes [provider]    Physical Exam Vitals: Blood pressure 90/60, pulse 90, height 5\' 2"  (1.575 m), weight 127 lb (57.6 kg). Body mass index is 23.23 kg/m.  General: NAD HEENT: normocephalic, anicteric Thyroid: no enlargement, no palpable nodules Pulmonary: No increased work of breathing, CTAB Cardiovascular: RRR, no murmurs, rubs or gallops Breast: Breasts symmetrical, no tenderness, no palpable nodules or masses, no skin or nipple retraction present, no nipple discharge.  No axillary or supraclavicular lymphadenopathy. Abdomen: soft, non-tender, non-distended.  Umbilicus without lesions.  No hepatomegaly, splenomegaly or masses palpable. No evidence of hernia  Genitourinary:  External: Normal external female genitalia.  Normal  urethral meatus, normal Bartholin's and Skene's glands.    Vagina: Normal vaginal mucosa, no evidence of prolapse.    Cervix: Grossly normal in appearance, no bleeding. IUD  strings easily visible at os.  Uterus: Non-enlarged, mobile, normal contour.  No CMT  Adnexa: ovaries non-enlarged, no adnexal masses  Rectal: deferred  Lymphatic: no evidence of inguinal lymphadenopathy Extremities: no edema, erythema, or tenderness Neurologic: Grossly intact Psychiatric: mood appropriate, affect full  Assessment: 32 y.o. G1P1001 for annual exam.  Plan: Problem List Items Addressed This Visit    None    Visit Diagnoses    Need for diphtheria-tetanus-pertussis (Tdap) vaccine    -  Primary   Relevant Medications   Tdap (BOOSTRIX) injection 0.5 mL (Completed) (Start on 08/26/2017  2:15 PM)   Women's annual routine gynecological examination       Pap smear for cervical cancer screening       Relevant Orders   Cytology - PAP   Screening for STDs (sexually transmitted diseases)       Relevant Orders   Cytology - PAP      1) STI screening was offered and accepted.  2) ASCCP guidelines for Pap  smears discussed.  Patient opts for every 3 year screening interval.  3) Contraception - She currently has a Mirena IUD placed 05/17/2015 and is satisfied with this method.  4) Routine healthcare maintenance including cholesterol, diabetes screening discussed: managed by PCP. Had labs done on 6/28; normal except low Vitamin D and B12 and she is taking supplements.  5) Smoking cessation: She has recently been cutting down on the number of cigarettes that she smokes daily, and no longer smokes in the house. Currently about 0.5/ppd. Not ready to try nicotine patches or gum, but working on decreasing number of cigarettes. Travelling long distances in the car is a trigger for her to smoke that she is trying to overcome.  6) Follow up 1 year for routine annual exam.  Marcelyn BruinsJacelyn Schmid, CNM 08/26/2017  2:13 PM

## 2017-08-27 LAB — CYTOLOGY - PAP
CHLAMYDIA, DNA PROBE: NEGATIVE
Diagnosis: NEGATIVE
HPV: NOT DETECTED
Neisseria Gonorrhea: NEGATIVE
TRICH (WINDOWPATH): POSITIVE — AB

## 2017-08-31 ENCOUNTER — Other Ambulatory Visit: Payer: Self-pay | Admitting: Maternal Newborn

## 2017-08-31 DIAGNOSIS — A599 Trichomoniasis, unspecified: Secondary | ICD-10-CM

## 2017-08-31 MED ORDER — METRONIDAZOLE 500 MG PO TABS: 2000.0000 mg | ORAL_TABLET | Freq: Once | ORAL | 0 refills | Status: AC

## 2017-08-31 NOTE — Progress Notes (Signed)
Rx sent for Flagyl. She is aware to pick up and that partner treatment is recommended.

## 2017-09-01 ENCOUNTER — Encounter: Payer: Self-pay | Admitting: Maternal Newborn

## 2017-09-05 ENCOUNTER — Other Ambulatory Visit: Payer: Self-pay

## 2017-09-05 ENCOUNTER — Ambulatory Visit
Admission: EM | Admit: 2017-09-05 | Discharge: 2017-09-05 | Disposition: A | Discharge status: Home / Self Care | Payer: Managed Care, Other (non HMO) | Attending: Family Medicine | Admitting: Family Medicine

## 2017-09-05 ENCOUNTER — Encounter: Payer: Self-pay | Admitting: Emergency Medicine

## 2017-09-05 DIAGNOSIS — B349 Viral infection, unspecified: Secondary | ICD-10-CM | POA: Diagnosis not present

## 2017-09-05 DIAGNOSIS — R0981 Nasal congestion: Secondary | ICD-10-CM

## 2017-09-05 DIAGNOSIS — J029 Acute pharyngitis, unspecified: Secondary | ICD-10-CM

## 2017-09-05 DIAGNOSIS — R197 Diarrhea, unspecified: Secondary | ICD-10-CM | POA: Diagnosis not present

## 2017-09-05 LAB — RAPID STREP SCREEN (MED CTR MEBANE ONLY): Streptococcus, Group A Screen (Direct): NEGATIVE

## 2017-09-05 MED ORDER — LORATADINE-PSEUDOEPHEDRINE ER 5-120 MG PO TB12: 1.0000 | ORAL_TABLET | Freq: Two times a day (BID) | ORAL | 0 refills | Status: DC

## 2017-09-05 NOTE — ED Provider Notes (Signed)
[facility]    CSN: [id number] Arrival date & time: 09/05/17  1353     History   Chief Complaint Chief Complaint  Patient presents with  . Sore Throat    HPI Karen Reyes is a 32 y.o. female.   Presents to the urgent care facility for evaluation of sore throat and diarrhea x1 day.  She is also had congestion and posterior pharyngeal drainage with pressures in both ears.  She denies any fevers, difficulty swallowing, chest pain, shortness of breath.  No abdominal pain.  No nausea or vomiting.  Diarrhea has been one episode daily with no noticeable blood.  She denies any skin rashes or headaches.  Does not take any medications for her symptoms. HPI  Past Medical History:  Diagnosis Date  . Vitamin D deficiency     Patient Active Problem List   Diagnosis Date Noted  . Tobacco use disorder, continuous 08/26/2017  . Vitamin D deficiency 08/26/2017    Past Surgical History:  Procedure Laterality Date  . CESAREAN SECTION  03/04/2010  . WISDOM TOOTH EXTRACTION     one    OB History    Gravida  1   Para  1   Term  1   Preterm      AB      Living  1     SAB      TAB      Ectopic      Multiple      Live Births  1            Home Medications    Prior to Admission medications   Medication Sig Start Date End Date Taking? Authorizing Provider  Cholecalciferol (D 10000) 10000 units CAPS Take 1 capsule by mouth daily. 08/16/17  Yes [provider]  levonorgestrel (MIRENA, 52 MG,) 20 MCG/24HR IUD 1 each by Intrauterine route once.    [provider]  loratadine-pseudoephedrine (CLARITIN-D 12 HOUR) 5-120 MG tablet Take 1 tablet by mouth 2 (two) times daily. 09/05/17   Evon Slack, PA-C  metroNIDAZOLE (FLAGYL) 500 MG tablet  08/31/17   [provider]    Family History Family History  Problem Relation Age of Onset  . Healthy Mother   . Healthy Father   . COPD Maternal Grandmother   . Hypertension Maternal  Grandmother   . Osteoporosis Maternal Grandmother   . Thyroid disease Maternal Grandmother   . Diabetes Maternal Grandfather        TYPE 2  . Heart disease Maternal Grandfather        DIED OF MI AGE 69    Social History Social History   Tobacco Use  . Smoking status: Current Every Day Smoker    Packs/day: 0.50    Types: Cigarettes  . Smokeless tobacco: Never Used  Substance Use Topics  . Alcohol use: Yes    Comment: socially  . Drug use: Yes    Types: Marijuana    Comment: LIGHT     Allergies   Ciprofloxacin and Penicillins   Review of Systems Review of Systems  Constitutional: Negative for fever.  HENT: Positive for congestion, ear pain, rhinorrhea, sinus pressure and sore throat. Negative for ear discharge, sinus pain, trouble swallowing and voice change.   Respiratory: Negative for cough, shortness of breath, wheezing and stridor.   Cardiovascular: Negative for chest pain.  Gastrointestinal: Positive for diarrhea. Negative for abdominal pain, nausea and vomiting.  Genitourinary: Negative for dysuria, flank pain and  pelvic pain.  Musculoskeletal: Negative for back pain and myalgias.  Skin: Negative for rash.  Neurological: Negative for dizziness and headaches.     Physical Exam Triage Vital Signs ED Triage Vitals  Enc Vitals Group     BP 09/05/17 1400 129/76     Pulse Rate 09/05/17 1400 (!) 104     Resp 09/05/17 1400 14     Temp 09/05/17 1400 98.6 F (37 C)     Temp Source 09/05/17 1400 Oral     SpO2 09/05/17 1400 100 %     Weight 09/05/17 1358 123 lb (55.8 kg)     Height 09/05/17 1358 5\' 1"  (1.549 m)     Head Circumference --      Peak Flow --      Pain Score 09/05/17 1358 3     Pain Loc --      Pain Edu? --      Excl. in GC? --    No data found.  Updated Vital Signs BP 129/76 (BP Location: Left Arm)   Pulse (!) 104   Temp 98.6 F (37 C) (Oral)   Resp 14   Ht 5\' 1"  (1.549 m)   Wt 123 lb (55.8 kg)   LMP  (LMP Unknown)   SpO2 100%   BMI  23.24 kg/m   Visual Acuity Right Eye Distance:   Left Eye Distance:   Bilateral Distance:    Right Eye Near:   Left Eye Near:    Bilateral Near:     Physical Exam  Constitutional: She is oriented to person, place, and time. She appears well-developed and well-nourished. No distress.  HENT:  Head: Normocephalic and atraumatic.  Right Ear: Hearing, tympanic membrane, external ear and ear canal normal.  Left Ear: Hearing, tympanic membrane, external ear and ear canal normal.  Nose: Rhinorrhea present.  Mouth/Throat: Mucous membranes are normal. No trismus in the jaw. No uvula swelling. Posterior oropharyngeal erythema present. No oropharyngeal exudate, posterior oropharyngeal edema or tonsillar abscesses. No tonsillar exudate.  Fluid behind both TMs with no signs of otitis media.  Pharynx is slightly erythematous with no exudates uvula is midline no peritonsillar swelling.  Eyes: Conjunctivae are normal. Right eye exhibits no discharge. Left eye exhibits no discharge.  Neck: Normal range of motion.  Cardiovascular: Normal rate, regular rhythm and normal heart sounds.  Pulmonary/Chest: Effort normal and breath sounds normal. No stridor. No respiratory distress. She has no wheezes. She has no rales.  Abdominal: Soft. She exhibits no distension and no mass. There is no tenderness. There is no guarding.  Musculoskeletal: Normal range of motion. She exhibits no deformity.  Lymphadenopathy:    She has cervical adenopathy (Posterior cervical).  Neurological: She is alert and oriented to person, place, and time. She has normal reflexes.  Skin: Skin is warm and dry.  Psychiatric: She has a normal mood and affect. Her behavior is normal. Thought content normal.     UC Treatments / Results  Labs (all labs ordered are listed, but only abnormal results are displayed) Labs Reviewed  RAPID STREP SCREEN ([facility] ONLY)  CULTURE, GROUP A STREP [facility])    EKG None  Radiology No results  found.  Procedures Procedures (including critical care time)  Medications Ordered in UC Medications - No data to display  Initial Impression / Assessment and Plan / UC Course  I have reviewed the triage vital signs and the nursing notes.  Pertinent labs & imaging results that were available during  my care of the patient were reviewed by me and considered in my medical decision making (see chart for details).     32 year old female with sore throat, diarrhea, congestion and ear pressure x1 day.  She is given a prescription for Claritin-D.  Rapid strep test was negative.  Culture pending.  No antibiotics given today.  She is educated on viral illness and educated on signs and symptoms return to the clinic for. Final Clinical Impressions(s) / UC Diagnoses   Final diagnoses:  Viral pharyngitis  Viral illness     Discharge Instructions     Please take Claritin-D as prescribed.  Take ibuprofen and/or Tylenol as needed for sore throat pain.  Make sure drinking lots of fluids.  If any difficulty swallowing, fevers, worsening symptoms or to changes in health please return to the clinic.   ED Prescriptions    Medication Sig Dispense Auth. Provider   loratadine-pseudoephedrine (CLARITIN-D 12 HOUR) 5-120 MG tablet Take 1 tablet by mouth 2 (two) times daily. 20 tablet Ronnette Juniper       Evon Slack, [place] 09/05/17 1423

## 2017-09-05 NOTE — Discharge Instructions (Addendum)
Please take Claritin-D as prescribed.  Take ibuprofen and/or Tylenol as needed for sore throat pain.  Make sure drinking lots of fluids.  If any difficulty swallowing, fevers, worsening symptoms or to changes in health please return to the clinic.

## 2017-09-05 NOTE — ED Triage Notes (Signed)
Patient c/o sore throat and chills that started yesterday.

## 2017-09-08 LAB — CULTURE, GROUP A STREP (THRC)

## 2018-07-22 ENCOUNTER — Encounter: Payer: Self-pay | Admitting: Emergency Medicine

## 2018-07-22 ENCOUNTER — Other Ambulatory Visit: Payer: Self-pay

## 2018-07-22 ENCOUNTER — Ambulatory Visit
Admission: EM | Admit: 2018-07-22 | Discharge: 2018-07-22 | Disposition: A | Discharge status: Home / Self Care | Payer: BC Managed Care – PPO | Attending: Family Medicine | Admitting: Family Medicine

## 2018-07-22 DIAGNOSIS — R1310 Dysphagia, unspecified: Secondary | ICD-10-CM

## 2018-07-22 DIAGNOSIS — R590 Localized enlarged lymph nodes: Secondary | ICD-10-CM

## 2018-07-22 DIAGNOSIS — M542 Cervicalgia: Secondary | ICD-10-CM

## 2018-07-22 MED ORDER — NAPROXEN 500 MG PO TABS: 500.0000 mg | ORAL_TABLET | Freq: Two times a day (BID) | ORAL | 0 refills | Status: DC | PRN

## 2018-07-22 NOTE — ED Triage Notes (Signed)
Patient c/o neck pain, neck swelling and bruising  That started yesterday.  Patient also reports numbness in right hand off and on since yesterday.

## 2018-07-22 NOTE — ED Provider Notes (Signed)
[facility]    CSN: [id number] Arrival date & time: 07/22/18  1050  History   Chief Complaint Chief Complaint  Patient presents with  . Neck Pain   HPI  33 year old female presents with multiple complaints.  Patient reports that her symptoms started yesterday.  Patient reports anterior neck pain.  Patient reports some difficulty swallowing.  Patient also notes that she has had some numbness of her right hand.  Patient also states she had an episode of nausea and vomiting few days ago.  She also feels that her neck is bruised.  Has felt feverish.  No documented fever.  No known inciting factor.  No medications or interventions tried.  Her neck pain seems to be worse with extension of her neck.  No relieving factors.  No other associated symptoms.  No other complaints.  PMH, Surgical Hx, Family Hx, Social History reviewed and updated as below.  Past Medical History:  Diagnosis Date  . Vitamin D deficiency     Patient Active Problem List   Diagnosis Date Noted  . Tobacco use disorder, continuous 08/26/2017  . Vitamin D deficiency 08/26/2017    Past Surgical History:  Procedure Laterality Date  . CESAREAN SECTION  03/04/2010  . WISDOM TOOTH EXTRACTION     one    OB History    Gravida  1   Para  1   Term  1   Preterm      AB      Living  1     SAB      TAB      Ectopic      Multiple      Live Births  1            Home Medications    Prior to Admission medications   Medication Sig Start Date End Date Taking? Authorizing Provider  Cholecalciferol (D 10000) 10000 units CAPS Take 1 capsule by mouth daily. 08/16/17   [provider]  levonorgestrel (MIRENA, 52 MG,) 20 MCG/24HR IUD 1 each by Intrauterine route once.    [provider]  naproxen (NAPROSYN) 500 MG tablet Take 1 tablet (500 mg total) by mouth 2 (two) times daily as needed for mild pain or moderate pain. 07/22/18   Tommie Sams, DO    Family History Family  History  Problem Relation Age of Onset  . Healthy Mother   . Healthy Father   . COPD Maternal Grandmother   . Hypertension Maternal Grandmother   . Osteoporosis Maternal Grandmother   . Thyroid disease Maternal Grandmother   . Diabetes Maternal Grandfather        TYPE 2  . Heart disease Maternal Grandfather        DIED OF MI AGE 25    Social History Social History   Tobacco Use  . Smoking status: Former Smoker    Packs/day: 0.50    Types: Cigarettes  . Smokeless tobacco: Never Used  Substance Use Topics  . Alcohol use: Yes    Comment: socially  . Drug use: Yes    Types: Marijuana    Comment: LIGHT     Allergies   Ciprofloxacin and Penicillins   Review of Systems Review of Systems  Constitutional: Positive for fever.  HENT: Positive for trouble swallowing.   Gastrointestinal: Positive for nausea and vomiting.  Musculoskeletal: Positive for neck pain.  Neurological: Positive for numbness.   Physical Exam Triage Vital Signs ED Triage Vitals  Enc Vitals Group  BP 07/22/18 1120 (!) 122/104     Pulse Rate 07/22/18 1120 90     Resp 07/22/18 1120 14     Temp 07/22/18 1120 98.9 F (37.2 C)     Temp Source 07/22/18 1120 Oral     SpO2 07/22/18 1120 100 %     Weight 07/22/18 1117 120 lb (54.4 kg)     Height 07/22/18 1117 5\' 4"  (1.626 m)     Head Circumference --      Peak Flow --      Pain Score 07/22/18 1117 5     Pain Loc --      Pain Edu? --      Excl. in GC? --    Updated Vital Signs BP (!) 122/104 (BP Location: Left Arm)   Pulse 90   Temp 98.9 F (37.2 C) (Oral)   Resp 14   Ht 5\' 4"  (1.626 m)   Wt 54.4 kg   SpO2 100%   BMI 20.60 kg/m   Visual Acuity Right Eye Distance:   Left Eye Distance:   Bilateral Distance:    Right Eye Near:   Left Eye Near:    Bilateral Near:     Physical Exam Vitals signs and nursing note reviewed.  Constitutional:      General: She is not in acute distress.    Appearance: Normal appearance. She is normal  weight.  HENT:     Head: Normocephalic and atraumatic.     Right Ear: Tympanic membrane normal.     Left Ear: Tympanic membrane normal.     Mouth/Throat:     Comments: Oropharynx clear.  Dry mucous membranes. Eyes:     General:        Right eye: No discharge.        Left eye: No discharge.     Conjunctiva/sclera: Conjunctivae normal.  Neck:     Musculoskeletal: Neck supple.     Comments: No appreciable thyromegaly. Cardiovascular:     Rate and Rhythm: Normal rate and regular rhythm.  Pulmonary:     Effort: Pulmonary effort is normal.     Breath sounds: Normal breath sounds.  Lymphadenopathy:     Cervical: Cervical adenopathy present.  Skin:    General: Skin is warm.     Findings: No bruising or rash.  Neurological:     Mental Status: She is alert.  Psychiatric:        Mood and Affect: Mood normal.        Behavior: Behavior normal.    UC Treatments / Results  Labs (all labs ordered are listed, but only abnormal results are displayed) Labs Reviewed - No data to display  EKG None  Radiology No results found.  Procedures Procedures (including critical care time)  Medications Ordered in UC Medications - No data to display  Initial Impression / Assessment and Plan / UC Course  I have reviewed the triage vital signs and the nursing notes.  Pertinent labs & imaging results that were available during my care of the patient were reviewed by me and considered in my medical decision making (see chart for details).    33 year old female presents with multiple complaints.  Patient is well-appearing.  Her exam is only notable for cervical lymphadenopathy.  I believe there is a component of anxiety as well.  Advised to see [facility] if her symptoms persist.  Naproxen as needed for pain.  Supportive care.  Final Clinical Impressions(s) / UC Diagnoses  Final diagnoses:  Anterior neck pain  Dysphagia, unspecified type  Cervical lymphadenopathy      Discharge Instructions     Medication as needed for pain.  If this persists, please see [facility] - [phone]) [phone].  Take care  Dr. Adriana Simas    ED Prescriptions    Medication Sig Dispense Auth. Provider   naproxen (NAPROSYN) 500 MG tablet Take 1 tablet (500 mg total) by mouth 2 (two) times daily as needed for mild pain or moderate pain. 30 tablet Tommie Sams, DO     Controlled Substance Prescriptions [place] Controlled Substance Registry consulted? Not Applicable   Tommie Sams, DO 07/22/18 1528

## 2018-07-22 NOTE — Discharge Instructions (Signed)
Medication as needed for pain.  If this persists, please see [facility] - [phone]) [phone].  Take care  Dr. Adriana Simas

## 2018-09-21 ENCOUNTER — Ambulatory Visit: Payer: Managed Care, Other (non HMO) | Admitting: Maternal Newborn

## 2018-09-22 ENCOUNTER — Other Ambulatory Visit (HOSPITAL_COMMUNITY)
Admission: RE | Admit: 2018-09-22 | Discharge: 2018-09-22 | Disposition: A | Discharge status: Home / Self Care | Payer: BC Managed Care – PPO | Source: Ambulatory Visit | Attending: Obstetrics & Gynecology | Admitting: Obstetrics & Gynecology

## 2018-09-22 ENCOUNTER — Ambulatory Visit (INDEPENDENT_AMBULATORY_CARE_PROVIDER_SITE_OTHER): Payer: BC Managed Care – PPO | Admitting: Obstetrics & Gynecology

## 2018-09-22 ENCOUNTER — Encounter: Payer: Self-pay | Admitting: Obstetrics & Gynecology

## 2018-09-22 ENCOUNTER — Other Ambulatory Visit: Payer: Self-pay

## 2018-09-22 VITALS — BP 120/80 | Ht 64.0 in | Wt 117.0 lb

## 2018-09-22 DIAGNOSIS — Z01419 Encounter for gynecological examination (general) (routine) without abnormal findings: Secondary | ICD-10-CM | POA: Diagnosis not present

## 2018-09-22 DIAGNOSIS — Z113 Encounter for screening for infections with a predominantly sexual mode of transmission: Secondary | ICD-10-CM

## 2018-09-22 NOTE — Progress Notes (Signed)
HPI:      [name] is a 33 y.o. G1P1001 who LMP was No LMP recorded. (Menstrual status: IUD)., she presents today for her annual examination. The patient has no complaints today. The patient is sexually active. Her last pap: approximate date 2019 and was normal and also showed trich (treated). The patient does perform self breast exams.  There is no notable family history of breast or ovarian cancer in her family.  The patient has regular exercise: yes.  The patient denies current symptoms of depression.    GYN History: Contraception: IUD and no SE or concerns (year 3)  PMHx: Past Medical History:  Diagnosis Date  . Vitamin D deficiency    Past Surgical History:  Procedure Laterality Date  . CESAREAN SECTION  03/04/2010  . WISDOM TOOTH EXTRACTION     one   Family History  Problem Relation Age of Onset  . Healthy Mother   . Healthy Father   . COPD Maternal Grandmother   . Hypertension Maternal Grandmother   . Osteoporosis Maternal Grandmother   . Thyroid disease Maternal Grandmother   . Diabetes Maternal Grandfather        TYPE 2  . Heart disease Maternal Grandfather        DIED OF MI AGE 73   Social History   Tobacco Use  . Smoking status: Former Smoker    Packs/day: 0.50    Types: Cigarettes  . Smokeless tobacco: Never Used  Substance Use Topics  . Alcohol use: Yes    Comment: socially  . Drug use: Yes    Types: Marijuana    Comment: LIGHT    Current Outpatient Medications:  .  levonorgestrel (MIRENA, 52 MG,) 20 MCG/24HR IUD, 1 each by Intrauterine route once., Disp: , Rfl:  .  Cholecalciferol (D 10000) 10000 units CAPS, Take 1 capsule by mouth daily., Disp: , Rfl:  Allergies: Ciprofloxacin and Penicillins  Review of Systems  Constitutional: Negative for chills, fever and malaise/fatigue.  HENT: Negative for congestion, sinus pain and sore throat.   Eyes: Negative for blurred vision and pain.  Respiratory: Negative for cough and wheezing.    Cardiovascular: Negative for chest pain and leg swelling.  Gastrointestinal: Negative for abdominal pain, constipation, diarrhea, heartburn, nausea and vomiting.  Genitourinary: Negative for dysuria, frequency, hematuria and urgency.  Musculoskeletal: Negative for back pain, joint pain, myalgias and neck pain.  Skin: Negative for itching and rash.  Neurological: Negative for dizziness, tremors and weakness.  Endo/Heme/Allergies: Does not bruise/bleed easily.  Psychiatric/Behavioral: Negative for depression. The patient is not nervous/anxious and does not have insomnia.     Objective: BP 120/80   Ht 5\' 4"  (1.626 m)   Wt 117 lb (53.1 kg)   BMI 20.08 kg/m   Filed Weights   09/22/18 1338  Weight: 117 lb (53.1 kg)   Body mass index is 20.08 kg/m. Physical Exam Constitutional:      General: She is not in acute distress.    Appearance: She is well-developed.  Genitourinary:     Pelvic exam was performed with patient supine.     Vagina, uterus and rectum normal.     No lesions in the vagina.     No vaginal bleeding.     No cervical motion tenderness, friability, lesion or polyp.     Uterus is mobile.     Uterus is not enlarged.     No uterine mass detected.    Uterus is midaxial.  No right or left adnexal mass present.     Right adnexa not tender.     Left adnexa not tender.  HENT:     Head: Normocephalic and atraumatic. No laceration.     Right Ear: Hearing normal.     Left Ear: Hearing normal.     Mouth/Throat:     Pharynx: Uvula midline.  Eyes:     Pupils: Pupils are equal, round, and reactive to light.  Neck:     Musculoskeletal: Normal range of motion and neck supple.     Thyroid: No thyromegaly.  Cardiovascular:     Rate and Rhythm: Normal rate and regular rhythm.     Heart sounds: No murmur. No friction rub. No gallop.   Pulmonary:     Effort: Pulmonary effort is normal. No respiratory distress.     Breath sounds: Normal breath sounds. No wheezing.  Chest:      Breasts:        Right: No mass, skin change or tenderness.        Left: No mass, skin change or tenderness.  Abdominal:     General: Bowel sounds are normal. There is no distension.     Palpations: Abdomen is soft.     Tenderness: There is no abdominal tenderness. There is no rebound.  Musculoskeletal: Normal range of motion.  Neurological:     Mental Status: She is alert and oriented to person, place, and time.     Cranial Nerves: No cranial nerve deficit.  Skin:    General: Skin is warm and dry.  Psychiatric:        Judgment: Judgment normal.  Vitals signs reviewed.    Assessment:  ANNUAL EXAM 1. Women's annual routine gynecological examination   2. Screening for STDs (sexually transmitted diseases)     Screening Plan:            1.  Cervical Screening-  DNA probe for chlamydia and GC obtained  2. Labs managed by PCP  3. Counseling for contraception: IUD   4. Screening for STDs (sexually transmitted diseases) - Cervicovaginal ancillary only      F/U  Return in about 1 year (around 09/22/2019) for Annual.  Annamarie MajorPaul Damontre Millea, MD, Merlinda FrederickFACOG [facility], [facility] 09/22/2018  1:47 PM

## 2018-09-22 NOTE — Patient Instructions (Signed)
PAP every three years Vaginal cultures today and yearly Labs yearly (with PCP)

## 2018-09-23 LAB — CERVICOVAGINAL ANCILLARY ONLY
Chlamydia: NEGATIVE
Neisseria Gonorrhea: NEGATIVE
Trichomonas: NEGATIVE

## 2018-10-31 ENCOUNTER — Telehealth: Payer: Self-pay

## 2018-10-31 NOTE — Telephone Encounter (Signed)
Pt calling; has UTI sxs; does she need to be seen or can a rx be called in?  [phone]

## 2018-11-01 ENCOUNTER — Other Ambulatory Visit: Payer: Self-pay | Admitting: Obstetrics & Gynecology

## 2018-11-01 ENCOUNTER — Ambulatory Visit: Payer: BC Managed Care – PPO | Admitting: Obstetrics & Gynecology

## 2018-11-01 ENCOUNTER — Other Ambulatory Visit: Payer: Self-pay

## 2018-11-01 ENCOUNTER — Ambulatory Visit (INDEPENDENT_AMBULATORY_CARE_PROVIDER_SITE_OTHER): Payer: BC Managed Care – PPO

## 2018-11-01 DIAGNOSIS — R300 Dysuria: Secondary | ICD-10-CM

## 2018-11-01 LAB — POCT URINALYSIS DIPSTICK
Bilirubin, UA: NEGATIVE
Blood, UA: NEGATIVE
Glucose, UA: NEGATIVE
Ketones, UA: NEGATIVE
Nitrite, UA: POSITIVE
Protein, UA: NEGATIVE
Spec Grav, UA: 1.01 (ref 1.010–1.025)
Urobilinogen, UA: 0.2 E.U./dL
pH, UA: 5 (ref 5.0–8.0)

## 2018-11-01 MED ORDER — NITROFURANTOIN MONOHYD MACRO 100 MG PO CAPS: 100.0000 mg | ORAL_CAPSULE | Freq: Two times a day (BID) | ORAL | 0 refills | Status: DC

## 2018-11-01 NOTE — Telephone Encounter (Signed)
Pt has appointment today with [facility]

## 2018-11-01 NOTE — Telephone Encounter (Signed)
Pt came by and dropped off urine sample and urine dip shows leukocytes and nitrates, I sent in urine culture, can you send in rx or do you want to wait for results?

## 2018-11-01 NOTE — Telephone Encounter (Signed)
OK, eRx done. Let her know/.

## 2018-11-02 NOTE — Telephone Encounter (Signed)
Pt aware.

## 2018-11-03 ENCOUNTER — Other Ambulatory Visit: Payer: Self-pay | Admitting: Obstetrics & Gynecology

## 2018-11-03 LAB — CULTURE, URINE COMPREHENSIVE

## 2018-11-03 MED ORDER — SULFAMETHOXAZOLE-TRIMETHOPRIM 800-160 MG PO TABS: 1.0000 | ORAL_TABLET | Freq: Two times a day (BID) | ORAL | 1 refills | Status: DC

## 2019-12-21 ENCOUNTER — Other Ambulatory Visit: Payer: Self-pay

## 2019-12-21 ENCOUNTER — Encounter: Payer: Self-pay | Admitting: Emergency Medicine

## 2019-12-21 ENCOUNTER — Ambulatory Visit
Admission: EM | Admit: 2019-12-21 | Discharge: 2019-12-21 | Disposition: A | Discharge status: Home / Self Care | Payer: BC Managed Care – PPO | Attending: Physician Assistant | Admitting: Physician Assistant

## 2019-12-21 DIAGNOSIS — N3090 Cystitis, unspecified without hematuria: Secondary | ICD-10-CM

## 2019-12-21 LAB — URINALYSIS, COMPLETE (UACMP) WITH MICROSCOPIC
Bilirubin Urine: NEGATIVE
Glucose, UA: NEGATIVE mg/dL
Ketones, ur: NEGATIVE mg/dL
Nitrite: NEGATIVE
Protein, ur: NEGATIVE mg/dL
Specific Gravity, Urine: 1.025 (ref 1.005–1.030)
pH: 6.5 (ref 5.0–8.0)

## 2019-12-21 MED ORDER — PHENAZOPYRIDINE HCL 200 MG PO TABS: 200.0000 mg | ORAL_TABLET | Freq: Three times a day (TID) | ORAL | 0 refills | Status: DC

## 2019-12-21 MED ORDER — SULFAMETHOXAZOLE-TRIMETHOPRIM 800-160 MG PO TABS: 1.0000 | ORAL_TABLET | Freq: Two times a day (BID) | ORAL | 1 refills | Status: DC

## 2019-12-21 NOTE — ED Provider Notes (Signed)
[facility]    CSN: [id number] Arrival date & time: 12/21/19  1446      History   Chief Complaint Chief Complaint  Patient presents with  . Dysuria    HPI Karen Reyes is a 34 y.o. female who presents with onset of dysuria, strong urine color, cloudy urine and darker yellow urine since yesterday. Denies flank pain or fever.     Past Medical History:  Diagnosis Date  . Vitamin D deficiency     Patient Active Problem List   Diagnosis Date Noted  . Tobacco use disorder, continuous 08/26/2017  . Vitamin D deficiency 08/26/2017    Past Surgical History:  Procedure Laterality Date  . CESAREAN SECTION  03/04/2010  . WISDOM TOOTH EXTRACTION     one    OB History    Gravida  1   Para  1   Term  1   Preterm      AB      Living  1     SAB      TAB      Ectopic      Multiple      Live Births  1            Home Medications    Prior to Admission medications   Medication Sig Start Date End Date Taking? Authorizing Provider  levonorgestrel (MIRENA, 52 MG,) 20 MCG/24HR IUD 1 each by Intrauterine route once.   Yes [provider]  phenazopyridine (PYRIDIUM) 200 MG tablet Take 1 tablet (200 mg total) by mouth 3 (three) times daily. 12/21/19   Rodriguez-Southworth, Nettie Elm, PA-C  sulfamethoxazole-trimethoprim (BACTRIM DS) 800-160 MG tablet Take 1 tablet by mouth 2 (two) times daily. 12/21/19   Rodriguez-Southworth, Nettie Elm, PA-C    Family History Family History  Problem Relation Age of Onset  . Healthy Mother   . Healthy Father   . COPD Maternal Grandmother   . Hypertension Maternal Grandmother   . Osteoporosis Maternal Grandmother   . Thyroid disease Maternal Grandmother   . Diabetes Maternal Grandfather        TYPE 2  . Heart disease Maternal Grandfather        DIED OF MI AGE 59    Social History Social History   Tobacco Use  . Smoking status: Former Smoker    Packs/day: 0.50    Types: Cigarettes  . Smokeless  tobacco: Never Used  Vaping Use  . Vaping Use: Never used  Substance Use Topics  . Alcohol use: Yes    Comment: socially  . Drug use: Yes    Types: Marijuana    Comment: LIGHT     Allergies   Ciprofloxacin and Penicillins   Review of Systems Review of Systems  Constitutional: Negative for chills, diaphoresis and fever.  Genitourinary: Positive for dysuria, frequency and urgency. Negative for pelvic pain and vaginal discharge.  Musculoskeletal: Negative for back pain and gait problem.     Physical Exam Triage Vital Signs ED Triage Vitals  Enc Vitals Group     BP 12/21/19 1500 113/85     Pulse Rate 12/21/19 1500 94     Resp --      Temp 12/21/19 1500 98.3 F (36.8 C)     Temp Source 12/21/19 1500 Oral     SpO2 12/21/19 1500 100 %     Weight 12/21/19 1457 117 lb 1 oz (53.1 kg)     Height 12/21/19 1457 5\' 4"  (1.626 m)  Head Circumference --      Peak Flow --      Pain Score 12/21/19 1457 0     Pain Loc --      Pain Edu? --      Excl. in GC? --    No data found.  Updated Vital Signs BP 113/85 (BP Location: Left Arm)   Pulse 94   Temp 98.3 F (36.8 C) (Oral)   Ht 5\' 4"  (1.626 m)   Wt 117 lb 1 oz (53.1 kg)   SpO2 100%   BMI 20.09 kg/m   Visual Acuity Right Eye Distance:   Left Eye Distance:   Bilateral Distance:    Right Eye Near:   Left Eye Near:    Bilateral Near:     Physical Exam Physical Exam Vitals and nursing note reviewed.  Constitutional:      General: She is not in acute distress.    Appearance: She is not toxic-appearing.  HENT:     Head: Normocephalic.     Right Ear: External ear normal.     Left Ear: External ear normal.  Eyes:     General: No scleral icterus.    Conjunctiva/sclera: Conjunctivae normal.  Pulmonary:     Effort: Pulmonary effort is normal.  Abdominal:     General: Bowel sounds are normal.     Palpations: Abdomen is soft. There is no mass.     Tenderness: There is no guarding or rebound.     Comments: - CVA  tenderness   Musculoskeletal:        General: Normal range of motion.     Cervical back: Neck supple.      Skin:    General: Skin is warm and dry.     Findings: No rash.  Neurological:     Mental Status: She is alert and oriented to person, place, and time.     Gait: Gait normal.  Psychiatric:        Mood and Affect: Mood normal.        Behavior: Behavior normal.        Thought Content: Thought content normal.        Judgment: Judgment normal.     UC Treatments / Results  Labs (all labs ordered are listed, but only abnormal results are displayed) Labs Reviewed  URINALYSIS, COMPLETE (UACMP) WITH MICROSCOPIC - Abnormal; Notable for the following components:      Result Value   APPearance HAZY (*)    Hgb urine dipstick SMALL (*)    Leukocytes,Ua TRACE (*)    Bacteria, UA MANY (*)    All other components within normal limits  URINE CULTURE    EKG   Radiology No results found.  Procedures Procedures (including critical care time)  Medications Ordered in UC Medications - No data to display  Initial Impression / Assessment and Plan / UC Course  I have reviewed the triage vital signs and the nursing notes. Has UTI. She was placed on Bactrim and Pyridium as noted  Her urine was sent out for a culture and we will inform her if we need to change her antibiotic Pertinent labs  results that were available during my care of the patient were reviewed by me and considered in my medical decision making (see chart for details).   Final Clinical Impressions(s) / UC Diagnoses   Final diagnoses:  Cystitis   Discharge Instructions   None    ED Prescriptions    Medication Sig Dispense  Auth. Provider   sulfamethoxazole-trimethoprim (BACTRIM DS) 800-160 MG tablet Take 1 tablet by mouth 2 (two) times daily. 14 tablet Rodriguez-Southworth, Nettie Elm, PA-C   phenazopyridine (PYRIDIUM) 200 MG tablet Take 1 tablet (200 mg total) by mouth 3 (three) times daily. 6 tablet  Rodriguez-Southworth, Nettie Elm, PA-C     PDMP not reviewed this encounter.   Garey Ham, PA-C 12/21/19 1540

## 2019-12-21 NOTE — ED Triage Notes (Signed)
Pt c/o dysuria, urine odor and dark color urine. Started yesterday. Denies lower back pain.

## 2019-12-23 LAB — URINE CULTURE
Culture: >=100000 — AB
Special Requests: NORMAL

## 2019-12-26 ENCOUNTER — Telehealth: Payer: Self-pay

## 2019-12-26 NOTE — Telephone Encounter (Signed)
Pt calling; went to UC a week ago; had UTI; given medication; has finished meds; still not feeling 100%; what to do?  [phone]  Unable to reach pt or leave msg for her.  The medicine stays in system a few days after finishing it.

## 2019-12-26 NOTE — Telephone Encounter (Signed)
Pt aware medication stays in system a few days; adv to see how she feels in a few days.  Pt concerned that urine is still a diff color and there is like a boil on the outside of vulva; adv to be sure to drink 64oz of water a day; as for the boil she can apply heat to area as many times a day as she would like.

## 2020-01-17 ENCOUNTER — Ambulatory Visit: Payer: BC Managed Care – PPO | Admitting: Obstetrics and Gynecology

## 2020-01-18 ENCOUNTER — Encounter: Payer: Self-pay | Admitting: Obstetrics

## 2020-01-18 ENCOUNTER — Other Ambulatory Visit (HOSPITAL_COMMUNITY)
Admission: RE | Admit: 2020-01-18 | Discharge: 2020-01-18 | Disposition: A | Discharge status: Home / Self Care | Payer: BC Managed Care – PPO | Source: Ambulatory Visit | Attending: Obstetrics | Admitting: Obstetrics

## 2020-01-18 ENCOUNTER — Other Ambulatory Visit: Payer: Self-pay

## 2020-01-18 ENCOUNTER — Ambulatory Visit (INDEPENDENT_AMBULATORY_CARE_PROVIDER_SITE_OTHER): Payer: BC Managed Care – PPO | Admitting: Obstetrics

## 2020-01-18 VITALS — BP 120/80 | Ht 63.0 in | Wt 120.0 lb

## 2020-01-18 DIAGNOSIS — Z124 Encounter for screening for malignant neoplasm of cervix: Secondary | ICD-10-CM

## 2020-01-18 DIAGNOSIS — Z01419 Encounter for gynecological examination (general) (routine) without abnormal findings: Secondary | ICD-10-CM | POA: Diagnosis not present

## 2020-01-18 DIAGNOSIS — F17209 Nicotine dependence, unspecified, with unspecified nicotine-induced disorders: Secondary | ICD-10-CM

## 2020-01-19 NOTE — Progress Notes (Addendum)
Gynecology Annual Exam  PCP: Pcp, No  Chief Complaint:  Chief Complaint  Patient presents with  . Gynecologic Exam  . Contraception    iud removed     History of Present Illness [name] is a 34 y.o. G1P1001 who LMP was No LMP recorded. (Menstrual status: IUD)., presents today for her annual examination.  Her menses are absent due to hysterectomy, menopause, surgical menopause and hormonal contraceptive.  She does not have vasomotor sx. She uses no meds.  She is single partner, contraception - IUD. She does not have vaginal dryness.  Last Pap: August 22, 2017  Results were: no abnormalities /neg HPV DNA.  Hx of STDs: trichomonas  Last mammogram: not yet due   There is no FH of breast cancer. There is no FH of ovarian cancer. The patient does do self-breast exams.  Colonoscopy: not yet due DEXA: has not been screened for osteoporosis  Tobacco use: The patient currently smokes 4 cigarettes per day for the past 5 years. Alcohol use: social drinker Exercise: moderately active  She does get adequate calcium and Vitamin D in her diet.   The patient is sexually active. She currently uses IUD: Present: yes for contraception. The patient wears seatbelts: yes.   last pap: was normal  The patient has regular exercise: yes.  The patient has ever been transfused or tattooed?: yes.  The patient reports that domestic violence in her life is absent.    Review of Systems: Review of Systems  Constitutional: Negative.   Eyes: Negative.   Respiratory: Negative.   Cardiovascular: Negative.   Gastrointestinal: Negative.   Genitourinary: Negative.   Musculoskeletal: Negative.   Skin: Negative.   Neurological: Negative.   Endo/Heme/Allergies: Negative.   Psychiatric/Behavioral: Negative.      Past Medical History:  Past Medical History:  Diagnosis Date  . Vitamin D deficiency     Past Surgical History:  Past Surgical History:  Procedure Laterality Date  . CESAREAN  SECTION  03/04/2010  . WISDOM TOOTH EXTRACTION     one    Medications: Prior to Admission medications   Medication Sig Start Date End Date Taking? Authorizing Provider  levonorgestrel (MIRENA, 52 MG,) 20 MCG/24HR IUD 1 each by Intrauterine route once.   Yes [provider]  phenazopyridine (PYRIDIUM) 200 MG tablet Take 1 tablet (200 mg total) by mouth 3 (three) times daily. 12/21/19   Rodriguez-Southworth, Nettie Elm, PA-C  sulfamethoxazole-trimethoprim (BACTRIM DS) 800-160 MG tablet Take 1 tablet by mouth 2 (two) times daily. 12/21/19   Rodriguez-Southworth, Nettie Elm, PA-C    Allergies:  Allergies  Allergen Reactions  . Ciprofloxacin Hives    BODY RASH, THROAT CLOSED UP  . Penicillins Hives and Nausea And Vomiting    HIVES; BREATHING DIFFICULTY    Gynecologic History: No LMP recorded. (Menstrual status: IUD).  Obstetric History: G1P1001  Social History:  Social History   Socioeconomic History  . Marital status: Single    Spouse name: Not on file  . Number of children: 1  . Years of education: 63  . Highest education level: Not on file  Occupational History  . Occupation: RETAIL    Comment: [employer]  Tobacco Use  . Smoking status: Former Smoker    Packs/day: 0.50    Types: Cigarettes  . Smokeless tobacco: Never Used  Vaping Use  . Vaping Use: Never used  Substance and Sexual Activity  . Alcohol use: Yes    Comment: socially  . Drug use: Yes  Types: Marijuana    Comment: LIGHT  . Sexual activity: Yes    Birth control/protection: I.U.D.  Other Topics Concern  . Not on file  Social History Narrative  . Not on file   Social Determinants of Health   Financial Resource Strain:   . Difficulty of Paying Living Expenses: Not on file  Food Insecurity:   . Worried About Programme researcher, broadcasting/film/video in the Last Year: Not on file  . Ran Out of Food in the Last Year: Not on file  Transportation Needs:   . Lack of Transportation (Medical): Not on file  . Lack of  Transportation (Non-Medical): Not on file  Physical Activity:   . Days of Exercise per Week: Not on file  . Minutes of Exercise per Session: Not on file  Stress:   . Feeling of Stress : Not on file  Social Connections:   . Frequency of Communication with Friends and Family: Not on file  . Frequency of Social Gatherings with Friends and Family: Not on file  . Attends Religious Services: Not on file  . Active Member of Clubs or Organizations: Not on file  . Attends Banker Meetings: Not on file  . Marital Status: Not on file  Intimate Partner Violence:   . Fear of Current or Ex-Partner: Not on file  . Emotionally Abused: Not on file  . Physically Abused: Not on file  . Sexually Abused: Not on file    Family History:  Family History  Problem Relation Age of Onset  . Healthy Mother   . Healthy Father   . COPD Maternal Grandmother   . Hypertension Maternal Grandmother   . Osteoporosis Maternal Grandmother   . Thyroid disease Maternal Grandmother   . Diabetes Maternal Grandfather        TYPE 2  . Heart disease Maternal Grandfather        DIED OF MI AGE 46     Physical Exam Vitals:  Vitals:   01/18/20 1540  BP: 120/80   Physical Exam Constitutional:      Appearance: Normal appearance. She is normal weight.  HENT:     Head: Normocephalic.     Nose: Nose normal.  Eyes:     Extraocular Movements: Extraocular movements intact.     Pupils: Pupils are equal, round, and reactive to light.  Cardiovascular:     Rate and Rhythm: Normal rate and regular rhythm.     Pulses: Normal pulses.     Heart sounds: Normal heart sounds.  Pulmonary:     Effort: Pulmonary effort is normal.     Breath sounds: Normal breath sounds.  Abdominal:     General: Abdomen is flat. Bowel sounds are normal.     Palpations: Abdomen is soft.  Genitourinary:    General: Normal vulva.     Comments: Shaves entire mons. Uterus is anteverted, mobile, midline. Vaginal walls are without  discharge,normal color with rugae Two IUD strings seen visible from the cervical os. Musculoskeletal:        General: Normal range of motion.     Cervical back: Normal range of motion and neck supple.  Skin:    General: Skin is warm and dry.  Neurological:     General: No focal deficit present.     Mental Status: She is alert and oriented to person, place, and time.  Psychiatric:        Mood and Affect: Mood normal.        Behavior:  Behavior normal.      Assessment: 34 y.o. G1P1001 well woman exam  Plan:  1) Contraception Education given regarding options for contraception, including removal of her IUD today ast her request. she is interested in conceiving another baby.Marland Kitchen  2) STI screening was offered  And tests for GC/CT, trich, retrieved.  We discussed the blood work for HIV, Hep B, and RPR. She declines a blood draw today.  3) Pap done  4) Routine healthcare maintenance including cholesterol, diabetes screening handled by her PCP 5) She receives preconceptual counseling today, including the importance of taking a daily vitamin with folic acid, quitting smoking ( she has cut back form 1 ppd to 1/4 ppd.), and stopping the use of MJ. She is interested in medication to assist her in smoking cessation, and will make an appointment for this as a follow up. 6)) Follow up 1 year for routine annual exam or PRN for + pregnancy test.  1. Women's annual routine gynecological examination   2. Cervical cancer screening  - Cytology - PAP - Cytology - PAP  3. Tobacco use disorder, continuous Discussed tobacco cessation in light of her desire for another rpegnancy. Reviewed the hazards of cigarette use towards the fetus, and the increased risks of HTN poor fetal growth and withdrawal for newborns after delivery. She is interested in addressing this with a prescirption, and Wellbutrin is discussed. A follow up appointment is set up today.   GYNECOLOGY OFFICE PROCEDURE NOTE  Karen Reyes is a 34 y.o. G1P1001  Also here for a Mirena  IUD removal. removal secondary to a desire to conceive...  IUD Removal  Patient identified, informed consent performed, consent signed.  Patient was in the dorsal lithotomy position, normal external genitalia was noted.  A speculum was placed in the patient's vagina, normal discharge was noted, no lesions. The cervix was visualized, no lesions, no abnormal discharge.  The strings of the IUD were grasped and pulled using ring forceps. The IUD was removed in its entirety.    Patient tolerated the procedure well.    Patient will use condoms for contraceptionfor a period of time  until she has plans for pregnancy. She was told to avoid teratogens, take PNV and folic acid.  Routine preventative health maintenance measures emphasized.   Mirna Mires, CNM  01/19/2020 3:08 PM   [facility], [facility]  CPT [phone]    Mirna Mires, CNM  01/19/2020 2:52 PM

## 2020-01-23 LAB — CYTOLOGY - PAP
Comment: NEGATIVE
Diagnosis: NEGATIVE
High risk HPV: POSITIVE — AB

## 2020-01-24 ENCOUNTER — Encounter: Payer: Self-pay | Admitting: Obstetrics

## 2020-02-16 ENCOUNTER — Other Ambulatory Visit: Payer: Self-pay

## 2020-02-16 ENCOUNTER — Ambulatory Visit
Admission: EM | Admit: 2020-02-16 | Discharge: 2020-02-16 | Disposition: A | Discharge status: Home / Self Care | Payer: BC Managed Care – PPO | Attending: Family Medicine | Admitting: Family Medicine

## 2020-02-16 DIAGNOSIS — Z87891 Personal history of nicotine dependence: Secondary | ICD-10-CM | POA: Insufficient documentation

## 2020-02-16 DIAGNOSIS — Z20822 Contact with and (suspected) exposure to covid-19: Secondary | ICD-10-CM | POA: Insufficient documentation

## 2020-02-16 DIAGNOSIS — J069 Acute upper respiratory infection, unspecified: Secondary | ICD-10-CM | POA: Diagnosis not present

## 2020-02-16 LAB — GROUP A STREP BY PCR: Group A Strep by PCR: NOT DETECTED

## 2020-02-16 LAB — RESP PANEL BY RT-PCR (FLU A&B, COVID) ARPGX2
Influenza A by PCR: NEGATIVE
Influenza B by PCR: NEGATIVE
SARS Coronavirus 2 by RT PCR: NEGATIVE

## 2020-02-16 MED ORDER — PROMETHAZINE-DM 6.25-15 MG/5ML PO SYRP: 5.0000 mL | ORAL_SOLUTION | Freq: Four times a day (QID) | ORAL | 0 refills | Status: DC | PRN

## 2020-02-16 MED ORDER — BENZONATATE 100 MG PO CAPS: 200.0000 mg | ORAL_CAPSULE | Freq: Three times a day (TID) | ORAL | 0 refills | Status: DC

## 2020-02-16 NOTE — ED Triage Notes (Signed)
Patient states that she has been having sore throat and cough, fatigue, chills and sweats x 2 days.

## 2020-02-16 NOTE — ED Provider Notes (Signed)
[facility]    CSN: [id number] Arrival date & time: 02/16/20  1037      History   Chief Complaint Chief Complaint  Patient presents with  . Cough    HPI Karen Reyes is a 34 y.o. female.   HPI   34 year old female here for evaluation of runny nose, sore throat, fatigue, cough, sweats and chills, nausea, diarrhea, and decreased appetite.  Patient reports has had symptoms for the past 2 days.  Patient denies ear pain or pressure, shortness of breath, changes to her sense of taste or smell, or abdominal pain.  Patient has not had her Covid or flu vaccines.  Past Medical History:  Diagnosis Date  . Vitamin D deficiency     Patient Active Problem List   Diagnosis Date Noted  . Tobacco use disorder, continuous 08/26/2017  . Vitamin D deficiency 08/26/2017    Past Surgical History:  Procedure Laterality Date  . CESAREAN SECTION  03/04/2010  . WISDOM TOOTH EXTRACTION     one    OB History    Gravida  1   Para  1   Term  1   Preterm      AB      Living  1     SAB      IAB      Ectopic      Multiple      Live Births  1            Home Medications    Prior to Admission medications   Medication Sig Start Date End Date Taking? Authorizing Provider  benzonatate (TESSALON) 100 MG capsule Take 2 capsules (200 mg total) by mouth every 8 (eight) hours. 02/16/20  Yes Becky Augusta, NP  promethazine-dextromethorphan (PROMETHAZINE-DM) 6.25-15 MG/5ML syrup Take 5 mLs by mouth 4 (four) times daily as needed. 02/16/20  Yes Becky Augusta, NP    Family History Family History  Problem Relation Age of Onset  . Healthy Mother   . Healthy Father   . COPD Maternal Grandmother   . Hypertension Maternal Grandmother   . Osteoporosis Maternal Grandmother   . Thyroid disease Maternal Grandmother   . Diabetes Maternal Grandfather        TYPE 2  . Heart disease Maternal Grandfather        DIED OF MI AGE 92    Social History Social History    Tobacco Use  . Smoking status: Former Smoker    Packs/day: 0.50    Types: Cigarettes  . Smokeless tobacco: Never Used  Vaping Use  . Vaping Use: Never used  Substance Use Topics  . Alcohol use: Yes    Comment: socially  . Drug use: Yes    Types: Marijuana    Comment: LIGHT     Allergies   Ciprofloxacin and Penicillins   Review of Systems Review of Systems  Constitutional: Positive for appetite change and fever. Negative for activity change.  HENT: Positive for congestion, rhinorrhea and sore throat. Negative for ear pain, sinus pressure and sinus pain.   Respiratory: Positive for cough and wheezing.   Gastrointestinal: Positive for diarrhea and nausea. Negative for vomiting.  Musculoskeletal: Positive for arthralgias and myalgias.  Skin: Negative for rash.  Neurological: Negative for headaches.  Hematological: Negative.   Psychiatric/Behavioral: Negative.      Physical Exam Triage Vital Signs ED Triage Vitals  Enc Vitals Group     BP 02/16/20 1213 126/73     Pulse Rate 02/16/20  1213 97     Resp 02/16/20 1213 18     Temp 02/16/20 1213 98.2 F (36.8 C)     Temp Source 02/16/20 1213 Oral     SpO2 02/16/20 1213 100 %     Weight 02/16/20 1212 119 lb (54 kg)     Height 02/16/20 1212 5\' 3"  (1.6 m)     Head Circumference --      Peak Flow --      Pain Score 02/16/20 1212 5     Pain Loc --      Pain Edu? --      Excl. in GC? --    No data found.  Updated Vital Signs BP 126/73 (BP Location: Left Arm)   Pulse 97   Temp 98.2 F (36.8 C) (Oral)   Resp 18   Ht 5\' 3"  (1.6 m)   Wt 119 lb (54 kg)   LMP 02/09/2020   SpO2 100%   BMI 21.08 kg/m   Visual Acuity Right Eye Distance:   Left Eye Distance:   Bilateral Distance:    Right Eye Near:   Left Eye Near:    Bilateral Near:     Physical Exam Vitals and nursing note reviewed.  Constitutional:      General: She is not in acute distress.    Appearance: Normal appearance. She is not toxic-appearing.   HENT:     Head: Normocephalic and atraumatic.     Right Ear: Tympanic membrane, ear canal and external ear normal.     Left Ear: Tympanic membrane, ear canal and external ear normal.     Nose: Rhinorrhea present.     Mouth/Throat:     Mouth: Mucous membranes are moist.     Pharynx: Oropharynx is clear. Posterior oropharyngeal erythema present.  Cardiovascular:     Rate and Rhythm: Normal rate and regular rhythm.     Pulses: Normal pulses.     Heart sounds: Normal heart sounds. No murmur heard. No gallop.   Pulmonary:     Effort: Pulmonary effort is normal.     Breath sounds: Normal breath sounds. No wheezing, rhonchi or rales.  Musculoskeletal:     Cervical back: Normal range of motion and neck supple.  Lymphadenopathy:     Cervical: Cervical adenopathy present.  Skin:    General: Skin is warm and dry.     Capillary Refill: Capillary refill takes less than 2 seconds.     Findings: No erythema or rash.  Neurological:     General: No focal deficit present.     Mental Status: She is alert and oriented to person, place, and time.  Psychiatric:        Mood and Affect: Mood normal.        Behavior: Behavior normal.        Thought Content: Thought content normal.        Judgment: Judgment normal.      UC Treatments / Results  Labs (all labs ordered are listed, but only abnormal results are displayed) Labs Reviewed  RESP PANEL BY RT-PCR (FLU A&B, COVID) ARPGX2  GROUP A STREP BY PCR    EKG   Radiology No results found.  Procedures Procedures (including critical care time)  Medications Ordered in UC Medications - No data to display  Initial Impression / Assessment and Plan / UC Course  I have reviewed the triage vital signs and the nursing notes.  Pertinent labs & imaging results that were available during my  care of the patient were reviewed by me and considered in my medical decision making (see chart for details).   Patient is here for evaluation of cold-like  symptoms that she has had for the past 2 days.  Patient was tested for Covid, flu, and strep at triage and all are negative.  Physical exam reveals erythematous and edematous nasal mucosa with clear nasal discharge.  Posterior oropharynx is erythematous and injected with clear postnasal drip.  Lungs are clear to auscultation in all quadrants.  We will treat patient for viral URI with cough.  Will have patient treat body aches and pain with Tylenol and ibuprofen, will give Tessalon Perles and Promethazine DM cough syrup for cough congestion and sleep.   Final Clinical Impressions(s) / UC Diagnoses   Final diagnoses:  Viral URI with cough     Discharge Instructions     Use over-the-counter Tylenol and ibuprofen as needed for pain and fever.  Gargle with warm salt water 2-3 times a day to help soothe your throat and alleviate your sore throat pain.  Take the [facility] during the day as needed for cough.  Use the Promethazine DM cough syrup at bedtime for cough, congestion, and sleep.  If you develop any new or worsening symptoms return for reevaluation or see your primary care provider.    ED Prescriptions    Medication Sig Dispense Auth. Provider   benzonatate (TESSALON) 100 MG capsule Take 2 capsules (200 mg total) by mouth every 8 (eight) hours. 21 capsule Becky Augusta, NP   promethazine-dextromethorphan (PROMETHAZINE-DM) 6.25-15 MG/5ML syrup Take 5 mLs by mouth 4 (four) times daily as needed. 118 mL Becky Augusta, NP     PDMP not reviewed this encounter.   Becky Augusta, NP 02/16/20 1337

## 2020-02-16 NOTE — Discharge Instructions (Addendum)
Use over-the-counter Tylenol and ibuprofen as needed for pain and fever.  Gargle with warm salt water 2-3 times a day to help soothe your throat and alleviate your sore throat pain.  Take the [facility] during the day as needed for cough.  Use the Promethazine DM cough syrup at bedtime for cough, congestion, and sleep.  If you develop any new or worsening symptoms return for reevaluation or see your primary care provider.

## 2020-02-22 ENCOUNTER — Ambulatory Visit: Payer: BC Managed Care – PPO | Admitting: Obstetrics

## 2020-10-07 ENCOUNTER — Other Ambulatory Visit: Payer: Self-pay

## 2020-10-07 ENCOUNTER — Ambulatory Visit
Admission: EM | Admit: 2020-10-07 | Discharge: 2020-10-07 | Disposition: A | Discharge status: Home / Self Care | Payer: BC Managed Care – PPO | Attending: Emergency Medicine | Admitting: Emergency Medicine

## 2020-10-07 DIAGNOSIS — S40861A Insect bite (nonvenomous) of right upper arm, initial encounter: Secondary | ICD-10-CM

## 2020-10-07 DIAGNOSIS — W57XXXA Bitten or stung by nonvenomous insect and other nonvenomous arthropods, initial encounter: Secondary | ICD-10-CM | POA: Diagnosis not present

## 2020-10-07 DIAGNOSIS — S40862A Insect bite (nonvenomous) of left upper arm, initial encounter: Secondary | ICD-10-CM

## 2020-10-07 MED ORDER — HYDROXYZINE HCL 25 MG PO TABS: 25.0000 mg | ORAL_TABLET | Freq: Four times a day (QID) | ORAL | 0 refills | Status: DC | PRN

## 2020-10-07 MED ORDER — TRIAMCINOLONE ACETONIDE 0.1 % EX CREA: 1.0000 "application " | TOPICAL_CREAM | Freq: Two times a day (BID) | CUTANEOUS | 0 refills | Status: DC

## 2020-10-07 NOTE — Discharge Instructions (Addendum)
Try Claritin or Zyrtec for the itching, and if that does not work, then take the Atarax.  Try the triamcinolone cream on the areas that itch.  Try wearing long sleeves for the next few days to see if that prevents further bites.  Here is a list of primary care providers who are taking new patients:  Dr. Elizabeth Sauer [address] [place] [address] [phone]  [facility] [address] [place], [place] [address] [phone]  [facility] [place] [address] [place] [address]  [phone]  [facility] [address] [place], [place] [address] [phone]  [facility] [address] [place]  [phone] [place], [place] [address]  Here are clinics/ other resources who will see you if you do not have insurance. Some have certain criteria that you must meet. Call them and find out what they are:  [facility]: [address], [place], [place] [address] Phone: [phone] Hours: First and Third Saturdays of each Month, 9 a.m. - 1 p.m.  [facility]: [address] [place], [place] [address] Phone: [phone] Hours: Tuesday, 4 p.m. - 8 p.m. Thursday, 1 p.m. - 8 p.m. Wednesday, 9 a.m. - [facility] [address] [place], [place] [address] Phone: [phone] Pharmacy Phone Number: [phone] Dental Phone Number: [phone] [facility] Insurance Help: [phone]  Dental Hours: Monday - Thursday, 8 a.m. - 6 p.m.  [facility] [address], [place], [place] [address] Phone: [phone] Pharmacy Phone Number: [phone] [facility] Insurance Help: [phone]  [facility] [address] [place] [place], [place] [address] Phone: [phone] Pharmacy Phone Number: [phone] [facility] Insurance Help: [phone]  [facility] [address] [place], [place] [address] Phone: [phone] [facility] Insurance Help: [phone]    [facility] [address] [place], [place] [address] Phone: [phone]  Go to www.goodrx.com  or www.costplusdrugs.com to look up your medications. This will give you a list of where you can find your prescriptions at the most affordable prices. Or ask the pharmacist what the cash price is, or if they have any other discount programs available to help make your medication more affordable. This can be less expensive than what you would pay with insurance.

## 2020-10-07 NOTE — ED Provider Notes (Signed)
HPI  SUBJECTIVE:  Karen Reyes is a 35 y.o. female who presents with Pruritic, erythematous "insect bites" on her bilateral upper extremities starting 2 days ago.  States she feels as if she is being bitten.  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  She reports itchy red wheals, which then go down and turn into scabs.  No fevers, body aches, sore throat, lymphadenopathy.  No new lotions, soaps, detergents.  No sensation being bitten at night, blood in the bedclothes in the morning.  No contacts with similar rash.  No aggravating or alleviating factors.  She has not tried anything for this.  No history of MRSA.  LMP: 2 days ago.  Denies possibility of being pregnant.  PMD: None.    Past Medical History:  Diagnosis Date   Vitamin D deficiency     Past Surgical History:  Procedure Laterality Date   CESAREAN SECTION  03/04/2010   WISDOM TOOTH EXTRACTION     one    Family History  Problem Relation Age of Onset   Healthy Mother    Healthy Father    COPD Maternal Grandmother    Hypertension Maternal Grandmother    Osteoporosis Maternal Grandmother    Thyroid disease Maternal Grandmother    Diabetes Maternal Grandfather        TYPE 2   Heart disease Maternal Grandfather        DIED OF MI AGE 46    Social History   Tobacco Use   Smoking status: Former    Packs/day: 0.50    Types: Cigarettes   Smokeless tobacco: Never  Vaping Use   Vaping Use: Never used  Substance Use Topics   Alcohol use: Yes    Comment: socially   Drug use: Yes    Types: Marijuana    Comment: LIGHT    No current facility-administered medications for this encounter.  Current Outpatient Medications:    hydrOXYzine (ATARAX/VISTARIL) 25 MG tablet, Take 1 tablet (25 mg total) by mouth every 6 (six) hours as needed for itching., Disp: 20 tablet, Rfl: 0   triamcinolone cream (KENALOG) 0.1 %, Apply 1 application topically 2 (two)  times daily. Apply for 2 weeks. May use on face, Disp: 30 g, Rfl: 0  Allergies  Allergen Reactions   Ciprofloxacin Hives    BODY RASH, THROAT CLOSED UP   Penicillins Hives and Nausea And Vomiting    HIVES; BREATHING DIFFICULTY     ROS  As noted in HPI.   Physical Exam  BP 122/80 (BP Location: Right Arm)   Pulse 96   Temp 98.6 F (37 C) (Oral)   Resp 16   Ht 5\' 2"  (1.575 m)   Wt 54.4 kg   LMP 10/04/2020   SpO2 100%   BMI 21.95 kg/m   Constitutional: Well developed, well nourished, no acute distress Eyes:  EOMI, conjunctiva normal bilaterally HENT: Normocephalic, atraumatic,mucus membranes moist Respiratory: Normal inspiratory effort Cardiovascular: Normal rate GI: nondistended skin: Several areas of nontender, blanchable erythema, increased temperature with central wheal on bilateral upper extremities.  No induration.  No crusting.  Positive areas of excoriations bilateral upper extremities.  No rash elsewhere.          Musculoskeletal: no deformities Neurologic: Alert & oriented x 3, no focal neuro deficits Psychiatric: Speech and behavior appropriate   ED Course   Medications - No data to display  No orders of the  defined types were placed in this encounter.   No results found for this or any previous visit (from the past 24 hour(s)). No results found.  ED Clinical Impression  1. Insect bite of left upper arm, initial encounter   2. Insect bite of right upper arm, initial encounter      ED Assessment/Plan  Patient states that this is happening only at work.  It is not happening at home which would be consistent with bedbugs.  There are no other contacts at home with similar rash.  I am not sure as to what is causing her symptoms, however, we will start her on Claritin or Zyrtec and some triamcinolone cream while she identifies the source of these bites.  Atarax if the Claritin or Zyrtec does not work.  No evidence of infection at this time.  Will  provide primary care list and order assistance in finding a PMD for routine care.   Discussed MDM, treatment plan, and plan for follow-up with patient. patient agrees with plan.   Meds ordered this encounter  Medications   triamcinolone cream (KENALOG) 0.1 %    Sig: Apply 1 application topically 2 (two) times daily. Apply for 2 weeks. May use on face    Dispense:  30 g    Refill:  0   hydrOXYzine (ATARAX/VISTARIL) 25 MG tablet    Sig: Take 1 tablet (25 mg total) by mouth every 6 (six) hours as needed for itching.    Dispense:  20 tablet    Refill:  0      *This clinic note was created using Scientist, clinical (histocompatibility and immunogenetics). Therefore, there may be occasional mistakes despite careful proofreading.  ?    Domenick Gong, MD 10/07/20 [phone]

## 2020-10-07 NOTE — ED Triage Notes (Signed)
Pt states she got bit yesterday by something and noticed a little rash on left arm, then saw some bites on her right arm. Not painful but does itch. Has not tried any OTC medication

## 2020-12-01 ENCOUNTER — Encounter: Payer: Self-pay | Admitting: Licensed Clinical Social Worker

## 2020-12-01 ENCOUNTER — Other Ambulatory Visit: Payer: Self-pay

## 2020-12-01 ENCOUNTER — Ambulatory Visit
Admission: EM | Admit: 2020-12-01 | Discharge: 2020-12-01 | Disposition: A | Discharge status: Home / Self Care | Payer: BC Managed Care – PPO | Attending: Physician Assistant | Admitting: Physician Assistant

## 2020-12-01 DIAGNOSIS — B349 Viral infection, unspecified: Secondary | ICD-10-CM

## 2020-12-01 DIAGNOSIS — J029 Acute pharyngitis, unspecified: Secondary | ICD-10-CM | POA: Diagnosis not present

## 2020-12-01 DIAGNOSIS — Z20822 Contact with and (suspected) exposure to covid-19: Secondary | ICD-10-CM | POA: Insufficient documentation

## 2020-12-01 DIAGNOSIS — R051 Acute cough: Secondary | ICD-10-CM | POA: Diagnosis not present

## 2020-12-01 DIAGNOSIS — R490 Dysphonia: Secondary | ICD-10-CM

## 2020-12-01 MED ORDER — PSEUDOEPH-BROMPHEN-DM 30-2-10 MG/5ML PO SYRP: 10.0000 mL | ORAL_SOLUTION | Freq: Four times a day (QID) | ORAL | 0 refills | Status: AC | PRN

## 2020-12-01 NOTE — Discharge Instructions (Signed)
URI/COLD SYMPTOMS: Your exam today is consistent with a viral illness. Antibiotics are not indicated at this time. Use medications as directed, including cough syrup, nasal saline, and decongestants. Your symptoms should improve over the next few days and resolve within 7-14 days. Increase rest and fluids. F/u if symptoms worsen or predominate such as sore throat, ear pain, productive cough, shortness of breath, or if you develop high fevers or worsening fatigue over the next several days.    You have received COVID testing today either for positive exposure, concerning symptoms that could be related to COVID infection, screening purposes, or re-testing after confirmed positive.  Your test obtained today checks for active viral infection in the last 1-2 weeks. If your test is negative now, you can still test positive later. So, if you do develop symptoms you should either get re-tested and/or isolate x 5 days and then strict mask use x 5 days (unvaccinated) or mask use x 10 days (vaccinated). Please follow CDC guidelines.  While Rapid antigen tests come back in 15-20 minutes, send out PCR/molecular test results typically come back within 1-3 days. In the mean time, if you are symptomatic, assume this could be a positive test and treat/monitor yourself as if you do have COVID.   We will call with test results if positive. Please download the MyChart app and set up a profile to access test results.   If symptomatic, go home and rest. Push fluids. Take Tylenol as needed for discomfort. Gargle warm salt water. Throat lozenges. Take Mucinex DM or Robitussin for cough. Humidifier in bedroom to ease coughing. Warm showers. Also review the COVID handout for more information.  COVID-19 INFECTION: The incubation period of COVID-19 is approximately 14 days after exposure, with most symptoms developing in roughly 4-5 days. Symptoms may range in severity from mild to critically severe. Roughly 80% of those infected  will have mild symptoms. People of any age may become infected with COVID-19 and have the ability to transmit the virus. The most common symptoms include: fever, fatigue, cough, body aches, headaches, sore throat, nasal congestion, shortness of breath, nausea, vomiting, diarrhea, changes in smell and/or taste.    COURSE OF ILLNESS Some patients may begin with mild disease which can progress quickly into critical symptoms. If your symptoms are worsening please call ahead to the Emergency Department and proceed there for further treatment. Recovery time appears to be roughly 1-2 weeks for mild symptoms and 3-6 weeks for severe disease.   GO IMMEDIATELY TO ER FOR FEVER YOU ARE UNABLE TO GET DOWN WITH TYLENOL, BREATHING PROBLEMS, CHEST PAIN, FATIGUE, LETHARGY, INABILITY TO EAT OR DRINK, ETC  QUARANTINE AND ISOLATION: To help decrease the spread of COVID-19 please remain isolated if you have COVID infection or are highly suspected to have COVID infection. This means -stay home and isolate to one room in the home if you live with others. Do not share a bed or bathroom with others while ill, sanitize and wipe down all countertops and keep common areas clean and disinfected. Stay home for 5 days. If you have no symptoms or your symptoms are resolving after 5 days, you can leave your house. Continue to wear a mask around others for 5 additional days. If you have been in close contact (within 6 feet) of someone diagnosed with COVID 19, you are advised to quarantine in your home for 14 days as symptoms can develop anywhere from 2-14 days after exposure to the virus. If you develop symptoms, you    must isolate.  Most current guidelines for COVID after exposure -unvaccinated: isolate 5 days and strict mask use x 5 days. Test on day 5 is possible -vaccinated: wear mask x 10 days if symptoms do not develop -You do not necessarily need to be tested for COVID if you have + exposure and  develop symptoms. Just isolate at  home x10 days from symptom onset During this global pandemic, CDC advises to practice social distancing, try to stay at least 6ft away from others at all times. Wear a face covering. Wash and sanitize your hands regularly and avoid going anywhere that is not necessary.  KEEP IN MIND THAT THE COVID TEST IS NOT 100% ACCURATE AND YOU SHOULD STILL DO EVERYTHING TO PREVENT POTENTIAL SPREAD OF VIRUS TO OTHERS (WEAR MASK, WEAR GLOVES, WASH HANDS AND SANITIZE REGULARLY). IF INITIAL TEST IS NEGATIVE, THIS MAY NOT MEAN YOU ARE DEFINITELY NEGATIVE. MOST ACCURATE TESTING IS DONE 5-7 DAYS AFTER EXPOSURE.   It is not advised by CDC to get re-tested after receiving a positive COVID test since you can still test positive for weeks to months after you have already cleared the virus.   *If you have not been vaccinated for COVID, I strongly suggest you consider getting vaccinated as long as there are no contraindications.   

## 2020-12-01 NOTE — ED Triage Notes (Signed)
Cough, sore throat, diarrhea, x 1 week. Fever earlier this week 101 to 102.

## 2020-12-01 NOTE — ED Provider Notes (Signed)
[facility]    CSN: [id number] Arrival date & time: 12/01/20  7829      History   Chief Complaint Chief Complaint  Patient presents with   Sore Throat   Cough    Voice loss     HPI Karen Reyes is a 35 y.o. female presenting for 6-day history of fatigue, fevers up to 101 (no fever today), cough, sore throat, nasal congestion, postnasal drainage.  Patient says she started to have a hoarse voice yesterday.  She says her child was sick with an upper respiratory infection.  She denies any known COVID exposure but has not been tested and neither was her child.  Patient says she took off medication at the onset but has not been taking anything in the past few days.  She says she is feeling worse and not better.  She denies any breathing difficulty or vomiting.  Has had some mild diarrhea.  She is otherwise healthy.  No other complaints.  HPI  Past Medical History:  Diagnosis Date   Vitamin D deficiency     Patient Active Problem List   Diagnosis Date Noted   Tobacco use disorder, continuous 08/26/2017   Vitamin D deficiency 08/26/2017    Past Surgical History:  Procedure Laterality Date   CESAREAN SECTION  03/04/2010   WISDOM TOOTH EXTRACTION     one    OB History     Gravida  1   Para  1   Term  1   Preterm      AB      Living  1      SAB      IAB      Ectopic      Multiple      Live Births  1            Home Medications    Prior to Admission medications   Medication Sig Start Date End Date Taking? Authorizing Provider  brompheniramine-pseudoephedrine-DM 30-2-10 MG/5ML syrup Take 10 mLs by mouth 4 (four) times daily as needed for up to 7 days. 12/01/20 12/08/20 Yes Eusebio Friendly B, PA-C  hydrOXYzine (ATARAX/VISTARIL) 25 MG tablet Take 1 tablet (25 mg total) by mouth every 6 (six) hours as needed for itching. 10/07/20   Domenick Gong, MD  triamcinolone cream (KENALOG) 0.1 % Apply 1 application topically 2 (two) times  daily. Apply for 2 weeks. May use on face 10/07/20   Domenick Gong, MD    Family History Family History  Problem Relation Age of Onset   Healthy Mother    Healthy Father    COPD Maternal Grandmother    Hypertension Maternal Grandmother    Osteoporosis Maternal Grandmother    Thyroid disease Maternal Grandmother    Diabetes Maternal Grandfather        TYPE 2   Heart disease Maternal Grandfather        DIED OF MI AGE 22    Social History Social History   Tobacco Use   Smoking status: Former    Packs/day: 0.50    Types: Cigarettes   Smokeless tobacco: Never  Vaping Use   Vaping Use: Never used  Substance Use Topics   Alcohol use: Not Currently    Comment: socially   Drug use: Yes    Types: Marijuana    Comment: LIGHT     Allergies   Ciprofloxacin and Penicillins   Review of Systems Review of Systems  Constitutional:  Positive for fatigue and fever (  resolved now). Negative for chills and diaphoresis.  HENT:  Positive for postnasal drip, rhinorrhea, sore throat and voice change. Negative for congestion, ear pain, sinus pressure and sinus pain.   Respiratory:  Positive for cough. Negative for shortness of breath.   Cardiovascular:  Negative for chest pain.  Gastrointestinal:  Positive for diarrhea. Negative for abdominal pain, nausea and vomiting.  Musculoskeletal:  Negative for arthralgias and myalgias.  Skin:  Negative for rash.  Neurological:  Negative for weakness and headaches.  Hematological:  Negative for adenopathy.    Physical Exam Triage Vital Signs ED Triage Vitals [12/01/20 0933]  Enc Vitals Group     BP 101/86     Pulse Rate 76     Resp 16     Temp 98.2 F (36.8 C)     Temp Source Oral     SpO2 100 %     Weight      Height      Head Circumference      Peak Flow      Pain Score      Pain Loc      Pain Edu?      Excl. in GC?    No data found.  Updated Vital Signs BP 101/86 (BP Location: Left Arm)   Pulse 76   Temp 98.2 F (36.8  C) (Oral)   Resp 16   LMP 10/31/2020 (Exact Date)   SpO2 100%      Physical Exam Vitals and nursing note reviewed.  Constitutional:      General: She is not in acute distress.    Appearance: Normal appearance. She is not ill-appearing or toxic-appearing.     Comments: +voice hoarseness  HENT:     Head: Normocephalic and atraumatic.     Nose: Congestion (mild) present.     Mouth/Throat:     Mouth: Mucous membranes are moist.     Pharynx: Oropharynx is clear.     Comments: +post nasal drainage, clear Eyes:     General: No scleral icterus.       Right eye: No discharge.        Left eye: No discharge.     Conjunctiva/sclera: Conjunctivae normal.  Cardiovascular:     Rate and Rhythm: Normal rate and regular rhythm.     Heart sounds: Normal heart sounds.  Pulmonary:     Effort: Pulmonary effort is normal. No respiratory distress.     Breath sounds: Normal breath sounds.  Musculoskeletal:     Cervical back: Neck supple.  Skin:    General: Skin is dry.  Neurological:     General: No focal deficit present.     Mental Status: She is alert. Mental status is at baseline.     Motor: No weakness.     Gait: Gait normal.  Psychiatric:        Mood and Affect: Mood normal.        Behavior: Behavior normal.        Thought Content: Thought content normal.     UC Treatments / Results  Labs (all labs ordered are listed, but only abnormal results are displayed) Labs Reviewed  SARS CORONAVIRUS 2 (TAT 6-24 HRS)    EKG   Radiology No results found.  Procedures Procedures (including critical care time)  Medications Ordered in UC Medications - No data to display  Initial Impression / Assessment and Plan / UC Course  I have reviewed the triage vital signs and the nursing notes.  Pertinent  labs & imaging results that were available during my care of the patient were reviewed by me and considered in my medical decision making (see chart for details).  35 year old female  presenting for fevers that have now resolved, fatigue, cough, congestion, postnasal drainage and sore throat as well as voice hoarseness.  Symptom onset 6 days ago.  Vitals normal and stable today and she is overall well-appearing.  She does have hoarse voice on exam and nasal congestion.  She also has postnasal drainage.  Chest is clear to auscultation heart regular rate and rhythm.  PCR COVID test obtained.  Current CDC guidelines, isolation protocol and ED precautions reviewed with patient.  Advised her that her symptoms are consistent with a viral illness.  Advised increasing rest and fluids.  Sent Bromfed-DM to pharmacy given her work note.  Advised her symptoms should be improved within 1 to 2 weeks.  She develops a return of the fever, worsening cough, breathing issue, ear pain or sinus pain she should return again for another exam.  Final Clinical Impressions(s) / UC Diagnoses   Final diagnoses:  Viral illness  Voice hoarseness  Acute cough  Sore throat     Discharge Instructions      URI/COLD SYMPTOMS: Your exam today is consistent with a viral illness. Antibiotics are not indicated at this time. Use medications as directed, including cough syrup, nasal saline, and decongestants. Your symptoms should improve over the next few days and resolve within 7-14 days. Increase rest and fluids. F/u if symptoms worsen or predominate such as sore throat, ear pain, productive cough, shortness of breath, or if you develop high fevers or worsening fatigue over the next several days.    You have received COVID testing today either for positive exposure, concerning symptoms that could be related to COVID infection, screening purposes, or re-testing after confirmed positive.  Your test obtained today checks for active viral infection in the last 1-2 weeks. If your test is negative now, you can still test positive later. So, if you do develop symptoms you should either get re-tested and/or isolate x 5  days and then strict mask use x 5 days (unvaccinated) or mask use x 10 days (vaccinated). Please follow CDC guidelines.  While Rapid antigen tests come back in 15-20 minutes, send out PCR/molecular test results typically come back within 1-3 days. In the mean time, if you are symptomatic, assume this could be a positive test and treat/monitor yourself as if you do have COVID.   We will call with test results if positive. Please download the MyChart app and set up a profile to access test results.   If symptomatic, go home and rest. Push fluids. Take Tylenol as needed for discomfort. Gargle warm salt water. Throat lozenges. Take Mucinex DM or Robitussin for cough. Humidifier in bedroom to ease coughing. Warm showers. Also review the COVID handout for more information.  COVID-19 INFECTION: The incubation period of COVID-19 is approximately 14 days after exposure, with most symptoms developing in roughly 4-5 days. Symptoms may range in severity from mild to critically severe. Roughly 80% of those infected will have mild symptoms. People of any age may become infected with COVID-19 and have the ability to transmit the virus. The most common symptoms include: fever, fatigue, cough, body aches, headaches, sore throat, nasal congestion, shortness of breath, nausea, vomiting, diarrhea, changes in smell and/or taste.    COURSE OF ILLNESS Some patients may begin with mild disease which can progress quickly into critical symptoms.  If your symptoms are worsening please call ahead to the Emergency Department and proceed there for further treatment. Recovery time appears to be roughly 1-2 weeks for mild symptoms and 3-6 weeks for severe disease.   GO IMMEDIATELY TO ER FOR FEVER YOU ARE UNABLE TO GET DOWN WITH TYLENOL, BREATHING PROBLEMS, CHEST PAIN, FATIGUE, LETHARGY, INABILITY TO EAT OR DRINK, ETC  QUARANTINE AND ISOLATION: To help decrease the spread of COVID-19 please remain isolated if you have COVID infection  or are highly suspected to have COVID infection. This means -stay home and isolate to one room in the home if you live with others. Do not share a bed or bathroom with others while ill, sanitize and wipe down all countertops and keep common areas clean and disinfected. Stay home for 5 days. If you have no symptoms or your symptoms are resolving after 5 days, you can leave your house. Continue to wear a mask around others for 5 additional days. If you have been in close contact (within 6 feet) of someone diagnosed with COVID 19, you are advised to quarantine in your home for 14 days as symptoms can develop anywhere from 2-14 days after exposure to the virus. If you develop symptoms, you  must isolate.  Most current guidelines for COVID after exposure -unvaccinated: isolate 5 days and strict mask use x 5 days. Test on day 5 is possible -vaccinated: wear mask x 10 days if symptoms do not develop -You do not necessarily need to be tested for COVID if you have + exposure and  develop symptoms. Just isolate at home x10 days from symptom onset During this global pandemic, CDC advises to practice social distancing, try to stay at least 45ft away from others at all times. Wear a face covering. Wash and sanitize your hands regularly and avoid going anywhere that is not necessary.  KEEP IN MIND THAT THE COVID TEST IS NOT 100% ACCURATE AND YOU SHOULD STILL DO EVERYTHING TO PREVENT POTENTIAL SPREAD OF VIRUS TO OTHERS (WEAR MASK, WEAR GLOVES, WASH HANDS AND SANITIZE REGULARLY). IF INITIAL TEST IS NEGATIVE, THIS MAY NOT MEAN YOU ARE DEFINITELY NEGATIVE. MOST ACCURATE TESTING IS DONE 5-7 DAYS AFTER EXPOSURE.   It is not advised by CDC to get re-tested after receiving a positive COVID test since you can still test positive for weeks to months after you have already cleared the virus.   *If you have not been vaccinated for COVID, I strongly suggest you consider getting vaccinated as long as there are no contraindications.        ED Prescriptions     Medication Sig Dispense Auth. Provider   brompheniramine-pseudoephedrine-DM 30-2-10 MG/5ML syrup Take 10 mLs by mouth 4 (four) times daily as needed for up to 7 days. 150 mL Shirlee Latch, PA-C      PDMP not reviewed this encounter.   Shirlee Latch, PA-C 12/01/20 1002

## 2020-12-02 LAB — SARS CORONAVIRUS 2 (TAT 6-24 HRS): SARS Coronavirus 2: NEGATIVE

## 2021-01-27 ENCOUNTER — Ambulatory Visit: Payer: BC Managed Care – PPO | Admitting: Obstetrics & Gynecology

## 2021-04-14 ENCOUNTER — Ambulatory Visit: Payer: BC Managed Care – PPO | Admitting: Obstetrics & Gynecology

## 2021-04-16 ENCOUNTER — Ambulatory Visit: Payer: BC Managed Care – PPO | Admitting: Obstetrics & Gynecology

## 2021-04-28 ENCOUNTER — Ambulatory Visit: Payer: BC Managed Care – PPO | Admitting: Nurse Practitioner

## 2021-10-21 ENCOUNTER — Ambulatory Visit: Payer: Self-pay | Admitting: Obstetrics

## 2021-11-10 ENCOUNTER — Encounter: Payer: Self-pay | Admitting: Obstetrics & Gynecology

## 2021-11-10 ENCOUNTER — Ambulatory Visit (INDEPENDENT_AMBULATORY_CARE_PROVIDER_SITE_OTHER): Payer: No Typology Code available for payment source | Admitting: Obstetrics & Gynecology

## 2021-11-10 ENCOUNTER — Other Ambulatory Visit (HOSPITAL_COMMUNITY)
Admission: RE | Admit: 2021-11-10 | Discharge: 2021-11-10 | Disposition: A | Discharge status: Home / Self Care | Payer: No Typology Code available for payment source | Source: Ambulatory Visit | Attending: Obstetrics | Admitting: Obstetrics

## 2021-11-10 VITALS — BP 122/70 | Ht 63.0 in | Wt 155.0 lb

## 2021-11-10 DIAGNOSIS — Z124 Encounter for screening for malignant neoplasm of cervix: Secondary | ICD-10-CM | POA: Diagnosis not present

## 2021-11-10 DIAGNOSIS — Z113 Encounter for screening for infections with a predominantly sexual mode of transmission: Secondary | ICD-10-CM | POA: Diagnosis not present

## 2021-11-10 NOTE — Progress Notes (Signed)
Subjective:     Karen Reyes is a 36 y.o.     G1 P1001 LMP 10-13-21 female here for a routine exam.  Current complaints: Pt c/o a long term relationship which broke up because of infidelity. She wanting STI testing because of possible exposure to STIs.   Personal health questionnaire reviewed: yes.   Gynecologic History Patient's last menstrual period was 10/13/2021 (exact date). Contraception: none Last Pap: 01-19-20. Results were: HPV hi risk positive, and nl pap   Obstetric History OB History  Gravida Para Term Preterm AB Living  1 1 1     1   SAB IAB Ectopic Multiple Live Births          1    # Outcome Date GA Lbr Len/2nd Weight Sex Delivery Anes PTL Lv  1 Term 03/04/10   8 lb 9 oz (3.884 kg) F [id number]-[id number]   LIV     Birth Comments: IOL, MSAFP, FITL     The following portions of the patient's history were reviewed and updated as appropriate: allergies, current medications, past family history, past medical history, past social history, past surgical history, and problem list.  Review of Systems A comprehensive review of systems was negative.    Objective:    BP 122/70   Ht 5\' 3"  (1.6 m)   Wt 155 lb (70.3 kg)   LMP 10/13/2021 (Exact Date)   BMI 27.46 kg/m  General appearance: alert, cooperative, and no distress Breasts: normal appearance, no masses or tenderness Abdomen: normal findings: no organomegaly and soft, non-tender Pelvic: cervix normal in appearance, external genitalia normal, no adnexal masses or tenderness, no cervical motion tenderness, uterus normal size, shape, and consistency, and vagina normal without discharge Extremities: extremities normal, atraumatic, no cyanosis or edema Skin: Skin color, texture, turgor normal. No rashes or lesions    Assessment:    Healthy female exam.    Plan:  Nu swab,Pap, STD testing  Follow up in: 1 year.

## 2021-11-11 LAB — CERVICOVAGINAL ANCILLARY ONLY
Bacterial Vaginitis (gardnerella): NEGATIVE
Candida Glabrata: NEGATIVE
Candida Vaginitis: NEGATIVE
Chlamydia: NEGATIVE
Comment: NEGATIVE
Comment: NEGATIVE
Comment: NEGATIVE
Comment: NEGATIVE
Comment: NEGATIVE
Comment: NORMAL
Neisseria Gonorrhea: NEGATIVE
Trichomonas: NEGATIVE

## 2021-11-11 LAB — HEP, RPR, HIV PANEL
HIV Screen 4th Generation wRfx: NONREACTIVE
Hepatitis B Surface Ag: NEGATIVE
RPR Ser Ql: NONREACTIVE

## 2021-11-13 LAB — CYTOLOGY - PAP
Adequacy: ABSENT
Chlamydia: NEGATIVE
Comment: NEGATIVE
Comment: NEGATIVE
Comment: NEGATIVE
Comment: NEGATIVE
Comment: NEGATIVE
Comment: NORMAL
Diagnosis: NEGATIVE
HPV 16: NEGATIVE
HPV 18 / 45: NEGATIVE
High risk HPV: POSITIVE — AB
Neisseria Gonorrhea: NEGATIVE
Trichomonas: NEGATIVE
